# Patient Record
Sex: Female | Born: 1980 | Race: Black or African American | Hispanic: No | Marital: Married | Smoking: Never smoker
Health system: Southern US, Community
[De-identification: ages and names within clinical notes are randomized; demographics above are authoritative.]

## PROBLEM LIST (undated history)

## (undated) ENCOUNTER — Inpatient Hospital Stay (HOSPITAL_COMMUNITY): Payer: Self-pay

## (undated) DIAGNOSIS — N390 Urinary tract infection, site not specified: Secondary | ICD-10-CM

## (undated) DIAGNOSIS — A419 Sepsis, unspecified organism: Secondary | ICD-10-CM

## (undated) DIAGNOSIS — E229 Hyperfunction of pituitary gland, unspecified: Secondary | ICD-10-CM

## (undated) DIAGNOSIS — A0100 Typhoid fever, unspecified: Secondary | ICD-10-CM

## (undated) DIAGNOSIS — R2230 Localized swelling, mass and lump, unspecified upper limb: Secondary | ICD-10-CM

## (undated) DIAGNOSIS — D352 Benign neoplasm of pituitary gland: Secondary | ICD-10-CM

## (undated) DIAGNOSIS — F329 Major depressive disorder, single episode, unspecified: Secondary | ICD-10-CM

## (undated) DIAGNOSIS — N926 Irregular menstruation, unspecified: Secondary | ICD-10-CM

## (undated) HISTORY — DX: Irregular menstruation, unspecified: N92.6

## (undated) HISTORY — DX: Urinary tract infection, site not specified: N39.0

## (undated) HISTORY — PX: ABDOMINAL SURGERY: SHX537

## (undated) HISTORY — DX: Sepsis, unspecified organism: A41.9

## (undated) HISTORY — DX: Typhoid fever, unspecified: A01.00

## (undated) HISTORY — DX: Localized swelling, mass and lump, unspecified upper limb: R22.30

## (undated) HISTORY — DX: Major depressive disorder, single episode, unspecified: F32.9

---

## 2003-03-02 ENCOUNTER — Encounter: Payer: Self-pay | Admitting: *Deleted

## 2003-03-02 ENCOUNTER — Encounter: Payer: Self-pay | Admitting: Family Medicine

## 2003-03-02 ENCOUNTER — Inpatient Hospital Stay (HOSPITAL_COMMUNITY): Admission: AD | Admit: 2003-03-02 | Discharge: 2003-03-02 | Payer: Self-pay | Admitting: *Deleted

## 2003-05-08 ENCOUNTER — Inpatient Hospital Stay (HOSPITAL_COMMUNITY): Admission: RE | Admit: 2003-05-08 | Discharge: 2003-05-16 | Payer: Self-pay | Admitting: Surgery

## 2003-05-08 ENCOUNTER — Encounter (INDEPENDENT_AMBULATORY_CARE_PROVIDER_SITE_OTHER): Payer: Self-pay

## 2003-11-23 ENCOUNTER — Encounter: Admission: RE | Admit: 2003-11-23 | Discharge: 2003-11-23 | Payer: Self-pay | Admitting: Internal Medicine

## 2004-01-24 ENCOUNTER — Encounter: Admission: RE | Admit: 2004-01-24 | Discharge: 2004-01-24 | Payer: Self-pay | Admitting: Family Medicine

## 2004-02-13 ENCOUNTER — Ambulatory Visit (HOSPITAL_COMMUNITY): Admission: RE | Admit: 2004-02-13 | Discharge: 2004-02-13 | Payer: Self-pay | Admitting: Family Medicine

## 2004-04-01 ENCOUNTER — Ambulatory Visit: Payer: Self-pay | Admitting: Obstetrics and Gynecology

## 2004-04-25 ENCOUNTER — Ambulatory Visit: Payer: Self-pay | Admitting: Internal Medicine

## 2004-12-06 ENCOUNTER — Emergency Department (HOSPITAL_COMMUNITY): Admission: EM | Admit: 2004-12-06 | Discharge: 2004-12-06 | Payer: Self-pay | Admitting: Emergency Medicine

## 2004-12-10 ENCOUNTER — Ambulatory Visit: Payer: Self-pay | Admitting: Internal Medicine

## 2005-01-09 ENCOUNTER — Ambulatory Visit: Payer: Self-pay | Admitting: Internal Medicine

## 2005-04-08 ENCOUNTER — Ambulatory Visit: Payer: Self-pay | Admitting: Internal Medicine

## 2005-04-21 ENCOUNTER — Ambulatory Visit: Payer: Self-pay | Admitting: Internal Medicine

## 2005-05-06 ENCOUNTER — Ambulatory Visit: Payer: Self-pay | Admitting: Internal Medicine

## 2005-05-13 ENCOUNTER — Ambulatory Visit: Payer: Self-pay | Admitting: Internal Medicine

## 2005-05-26 ENCOUNTER — Ambulatory Visit: Payer: Self-pay | Admitting: Internal Medicine

## 2005-06-05 ENCOUNTER — Ambulatory Visit: Payer: Self-pay | Admitting: Internal Medicine

## 2005-06-10 ENCOUNTER — Ambulatory Visit: Payer: Self-pay | Admitting: Internal Medicine

## 2005-09-08 ENCOUNTER — Encounter (INDEPENDENT_AMBULATORY_CARE_PROVIDER_SITE_OTHER): Payer: Self-pay | Admitting: Internal Medicine

## 2005-12-28 ENCOUNTER — Ambulatory Visit: Payer: Self-pay | Admitting: Internal Medicine

## 2006-01-04 ENCOUNTER — Ambulatory Visit: Payer: Self-pay | Admitting: Internal Medicine

## 2006-06-03 ENCOUNTER — Encounter (INDEPENDENT_AMBULATORY_CARE_PROVIDER_SITE_OTHER): Payer: Self-pay | Admitting: *Deleted

## 2006-06-03 ENCOUNTER — Ambulatory Visit: Payer: Self-pay | Admitting: *Deleted

## 2006-06-03 DIAGNOSIS — N926 Irregular menstruation, unspecified: Secondary | ICD-10-CM | POA: Insufficient documentation

## 2006-06-03 DIAGNOSIS — A0100 Typhoid fever, unspecified: Secondary | ICD-10-CM | POA: Insufficient documentation

## 2006-06-03 DIAGNOSIS — N979 Female infertility, unspecified: Secondary | ICD-10-CM

## 2006-06-03 LAB — CONVERTED CEMR LAB: Streptococcus, Group A Screen (Direct): NEGATIVE

## 2006-07-19 ENCOUNTER — Telehealth (INDEPENDENT_AMBULATORY_CARE_PROVIDER_SITE_OTHER): Payer: Self-pay | Admitting: *Deleted

## 2006-07-29 ENCOUNTER — Ambulatory Visit: Payer: Self-pay | Admitting: Internal Medicine

## 2006-07-29 ENCOUNTER — Encounter (INDEPENDENT_AMBULATORY_CARE_PROVIDER_SITE_OTHER): Payer: Self-pay | Admitting: Unknown Physician Specialty

## 2006-07-29 DIAGNOSIS — N941 Unspecified dyspareunia: Secondary | ICD-10-CM

## 2006-07-29 LAB — CONVERTED CEMR LAB
Bilirubin Urine: NEGATIVE
Blood in Urine, dipstick: NEGATIVE
Glucose, Urine, Semiquant: NEGATIVE
Ketones, urine, test strip: NEGATIVE
Nitrite: NEGATIVE
Protein, U semiquant: 30
Specific Gravity, Urine: 1.02
Urobilinogen, UA: 0.2
WBC Urine, dipstick: NEGATIVE
pH: 7.5

## 2007-03-15 ENCOUNTER — Ambulatory Visit (HOSPITAL_BASED_OUTPATIENT_CLINIC_OR_DEPARTMENT_OTHER): Admission: RE | Admit: 2007-03-15 | Discharge: 2007-03-15 | Payer: Self-pay | Admitting: Orthopaedic Surgery

## 2007-07-19 ENCOUNTER — Ambulatory Visit: Payer: Self-pay | Admitting: Internal Medicine

## 2007-07-19 ENCOUNTER — Encounter (INDEPENDENT_AMBULATORY_CARE_PROVIDER_SITE_OTHER): Payer: Self-pay | Admitting: Internal Medicine

## 2007-07-19 DIAGNOSIS — R042 Hemoptysis: Secondary | ICD-10-CM | POA: Insufficient documentation

## 2007-07-19 DIAGNOSIS — R040 Epistaxis: Secondary | ICD-10-CM

## 2007-07-19 DIAGNOSIS — H5310 Unspecified subjective visual disturbances: Secondary | ICD-10-CM | POA: Insufficient documentation

## 2007-07-19 LAB — CONVERTED CEMR LAB
Eosinophils Absolute: 0.1 10*3/uL (ref 0.0–0.7)
Eosinophils Relative: 2 % (ref 0–5)
HCT: 43.5 % (ref 36.0–46.0)
Lymphs Abs: 2.1 10*3/uL (ref 0.7–4.0)
MCV: 90.2 fL (ref 78.0–100.0)
Monocytes Absolute: 0.5 10*3/uL (ref 0.1–1.0)
Platelets: 250 10*3/uL (ref 150–400)
RDW: 12.1 % (ref 11.5–15.5)
WBC: 4.5 10*3/uL (ref 4.0–10.5)

## 2007-07-21 ENCOUNTER — Encounter (INDEPENDENT_AMBULATORY_CARE_PROVIDER_SITE_OTHER): Payer: Self-pay | Admitting: Internal Medicine

## 2007-07-25 ENCOUNTER — Encounter (INDEPENDENT_AMBULATORY_CARE_PROVIDER_SITE_OTHER): Payer: Self-pay | Admitting: Internal Medicine

## 2007-07-28 ENCOUNTER — Encounter (INDEPENDENT_AMBULATORY_CARE_PROVIDER_SITE_OTHER): Payer: Self-pay | Admitting: Internal Medicine

## 2007-08-17 ENCOUNTER — Encounter (INDEPENDENT_AMBULATORY_CARE_PROVIDER_SITE_OTHER): Payer: Self-pay | Admitting: Internal Medicine

## 2007-10-07 ENCOUNTER — Ambulatory Visit (HOSPITAL_COMMUNITY): Admission: RE | Admit: 2007-10-07 | Discharge: 2007-10-07 | Payer: Self-pay | Admitting: Internal Medicine

## 2007-10-19 ENCOUNTER — Ambulatory Visit (HOSPITAL_COMMUNITY): Admission: RE | Admit: 2007-10-19 | Discharge: 2007-10-19 | Payer: Self-pay | Admitting: Obstetrics and Gynecology

## 2007-11-29 ENCOUNTER — Other Ambulatory Visit: Admission: RE | Admit: 2007-11-29 | Discharge: 2007-11-29 | Payer: Self-pay | Admitting: Gynecology

## 2007-12-28 ENCOUNTER — Ambulatory Visit: Payer: Self-pay | Admitting: *Deleted

## 2007-12-28 ENCOUNTER — Encounter: Payer: Self-pay | Admitting: Internal Medicine

## 2007-12-28 DIAGNOSIS — F3289 Other specified depressive episodes: Secondary | ICD-10-CM

## 2007-12-28 DIAGNOSIS — F329 Major depressive disorder, single episode, unspecified: Secondary | ICD-10-CM

## 2007-12-28 DIAGNOSIS — K055 Other periodontal diseases: Secondary | ICD-10-CM

## 2007-12-28 HISTORY — DX: Other specified depressive episodes: F32.89

## 2007-12-28 HISTORY — DX: Major depressive disorder, single episode, unspecified: F32.9

## 2007-12-28 LAB — CONVERTED CEMR LAB
Albumin: 4.8 g/dL (ref 3.5–5.2)
Beta hcg, urine, semiquantitative: POSITIVE
CO2: 19 meq/L (ref 19–32)
Calcium: 10.2 mg/dL (ref 8.4–10.5)
Chloride: 103 meq/L (ref 96–112)
Free T4: 1.11 ng/dL (ref 0.89–1.80)
Glucose, Bld: 67 mg/dL — ABNORMAL LOW (ref 70–99)
Potassium: 4.1 meq/L (ref 3.5–5.3)
Sodium: 136 meq/L (ref 135–145)
TSH: 0.924 microintl units/mL (ref 0.350–4.50)
Total Bilirubin: 0.4 mg/dL (ref 0.3–1.2)
Total Protein: 7.8 g/dL (ref 6.0–8.3)

## 2007-12-29 DIAGNOSIS — O0001 Abdominal pregnancy with intrauterine pregnancy: Secondary | ICD-10-CM

## 2008-01-11 ENCOUNTER — Encounter: Payer: Self-pay | Admitting: Obstetrics and Gynecology

## 2008-01-11 ENCOUNTER — Ambulatory Visit: Payer: Self-pay | Admitting: Obstetrics & Gynecology

## 2008-02-08 ENCOUNTER — Ambulatory Visit: Payer: Self-pay | Admitting: Obstetrics & Gynecology

## 2008-02-08 ENCOUNTER — Ambulatory Visit (HOSPITAL_COMMUNITY): Admission: RE | Admit: 2008-02-08 | Discharge: 2008-02-08 | Payer: Self-pay | Admitting: Family Medicine

## 2008-02-15 ENCOUNTER — Ambulatory Visit: Payer: Self-pay | Admitting: Obstetrics & Gynecology

## 2008-03-01 ENCOUNTER — Ambulatory Visit: Payer: Self-pay | Admitting: Obstetrics & Gynecology

## 2008-03-30 ENCOUNTER — Ambulatory Visit (HOSPITAL_COMMUNITY): Admission: RE | Admit: 2008-03-30 | Discharge: 2008-03-30 | Payer: Self-pay | Admitting: Obstetrics & Gynecology

## 2008-04-03 ENCOUNTER — Ambulatory Visit (HOSPITAL_COMMUNITY): Admission: RE | Admit: 2008-04-03 | Discharge: 2008-04-03 | Payer: Self-pay | Admitting: Obstetrics & Gynecology

## 2008-06-04 ENCOUNTER — Ambulatory Visit (HOSPITAL_COMMUNITY): Admission: RE | Admit: 2008-06-04 | Discharge: 2008-06-04 | Payer: Self-pay | Admitting: Obstetrics & Gynecology

## 2008-06-27 ENCOUNTER — Ambulatory Visit (HOSPITAL_COMMUNITY): Admission: RE | Admit: 2008-06-27 | Discharge: 2008-06-27 | Payer: Self-pay | Admitting: Obstetrics & Gynecology

## 2008-07-12 ENCOUNTER — Inpatient Hospital Stay (HOSPITAL_COMMUNITY): Admission: AD | Admit: 2008-07-12 | Discharge: 2008-07-15 | Payer: Self-pay | Admitting: Obstetrics & Gynecology

## 2008-07-31 ENCOUNTER — Inpatient Hospital Stay (HOSPITAL_COMMUNITY): Admission: AD | Admit: 2008-07-31 | Discharge: 2008-08-02 | Payer: Self-pay | Admitting: Obstetrics

## 2008-07-31 ENCOUNTER — Encounter: Payer: Self-pay | Admitting: Obstetrics

## 2009-02-07 IMAGING — CT CT ABDOMEN W/ CM
2 of 5 series · 16 of 46 positions shown, 18 images · IV contrast (OMNI 300/WATER & 100 ML OMNI 300)
Comparison: None

CT ABDOMEN

CLINICAL DATA: Abdominal pain: reported history of bowel resection

CT ABDOMEN AND PELVIS WITH CONTRAST
TECHNIQUE: Multidetector CT imaging of the abdomen and pelvis was
performed using the standard protocol following bolus
administration of intravenous contrast.
Contrast: 100 ml 2mnipaque-XBB

[Series 2: routine abdomen · axial · 0.70mm/px · z∈[-388,-28]mm · 13 of 84 slices shown, 15 images]
[im 6/84  soft-tissue]
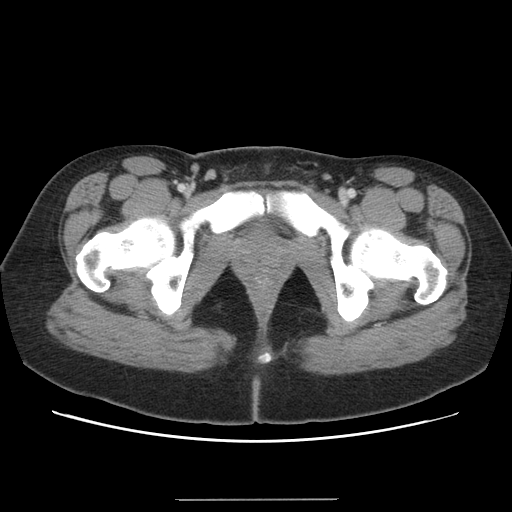
[im 6/84  bone]
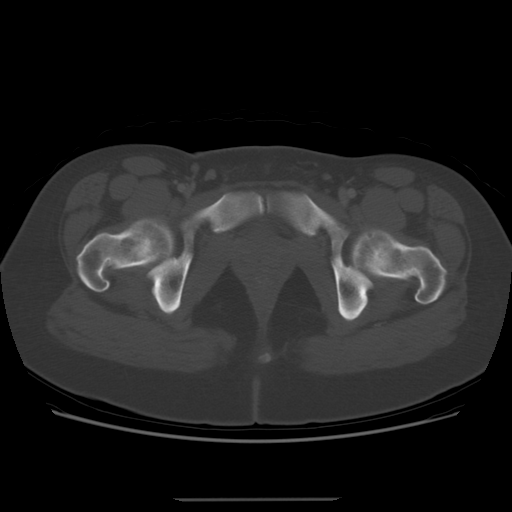
[im 11/84  soft-tissue]
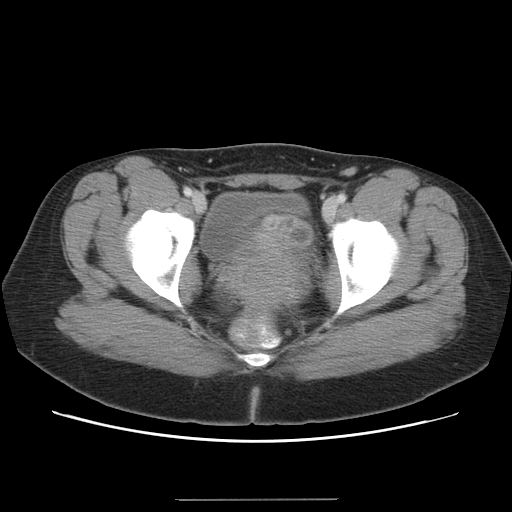
[im 16/84  soft-tissue]
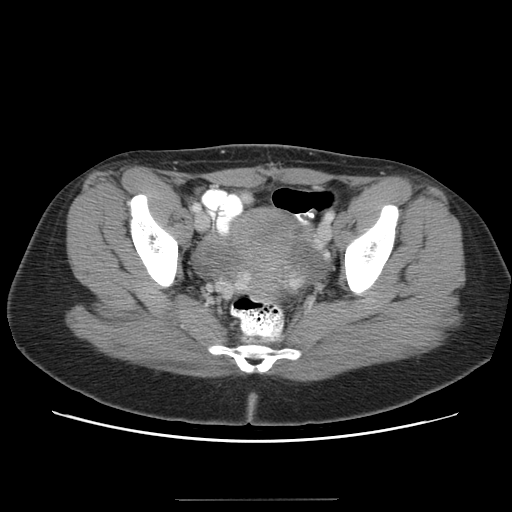
[im 26/84  soft-tissue]
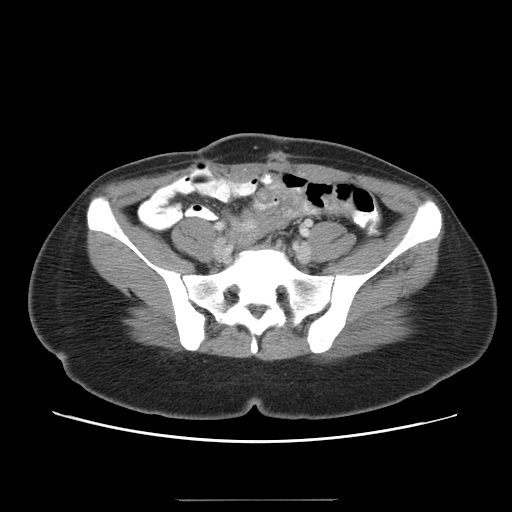
[im 32/84  soft-tissue]
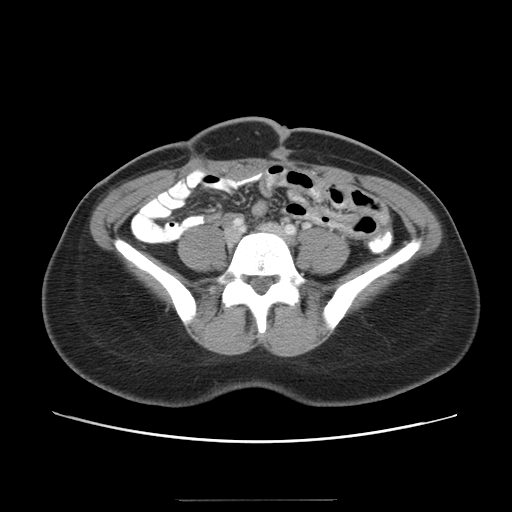
[im 37/84  soft-tissue]
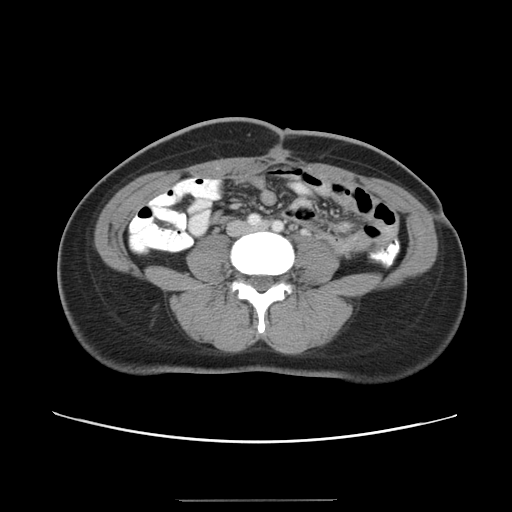
[im 42/84  soft-tissue]
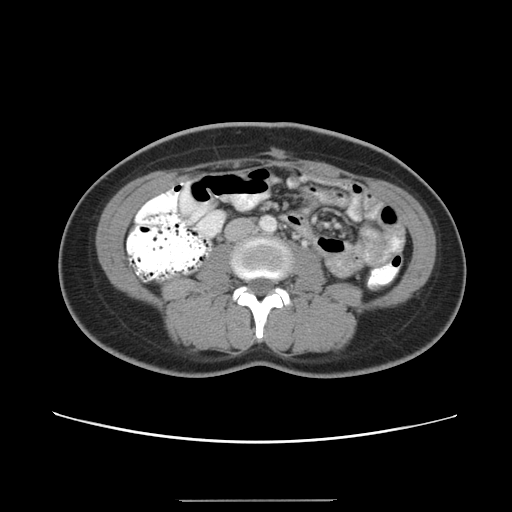
[im 47/84  soft-tissue]
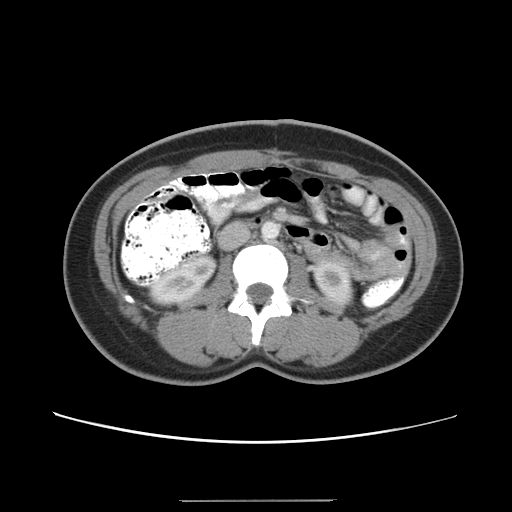
[im 52/84  soft-tissue]
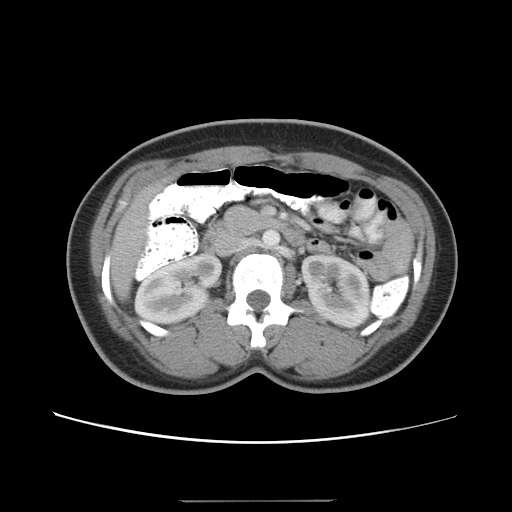
[im 52/84  bone]
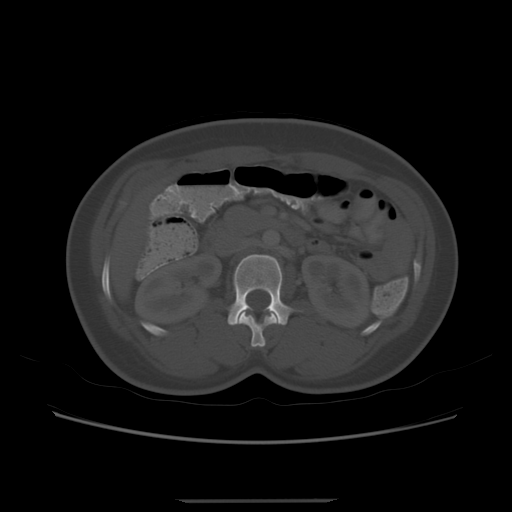
[im 58/84  soft-tissue]
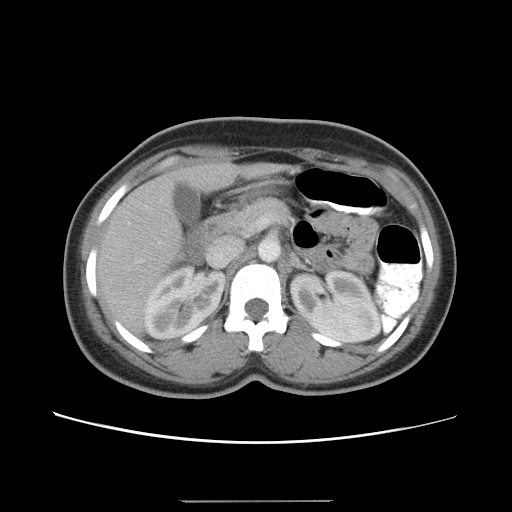
[im 68/84  soft-tissue]
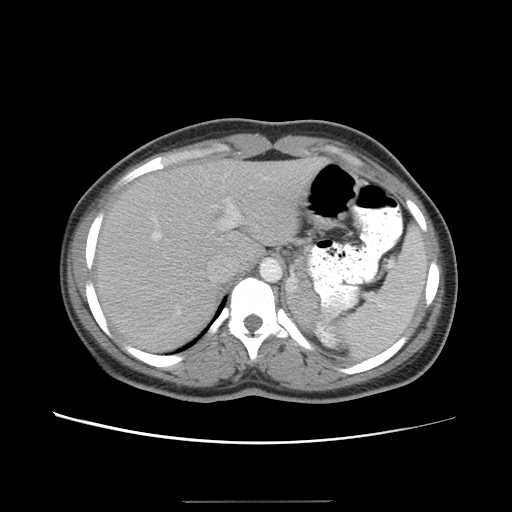
[im 73/84  soft-tissue]
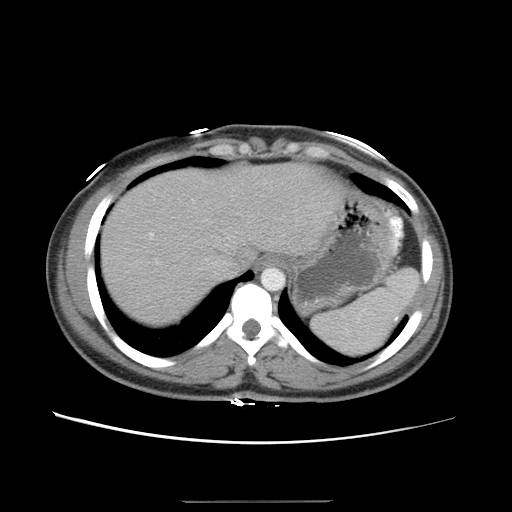
[im 78/84  soft-tissue]
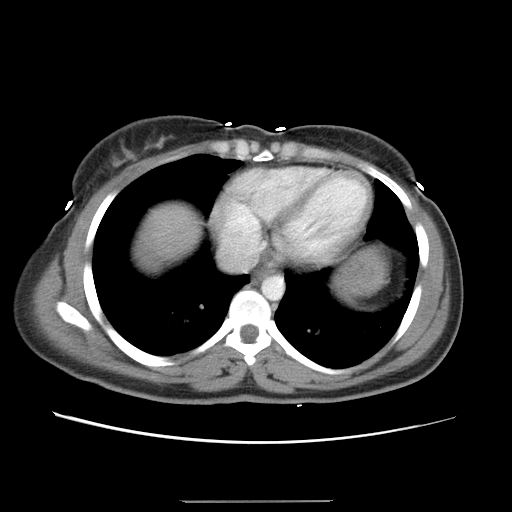

[Series 401: reformatted · coronal · 0.86mm/px · 3 of 83 slices shown]
[im 28/83  soft-tissue]
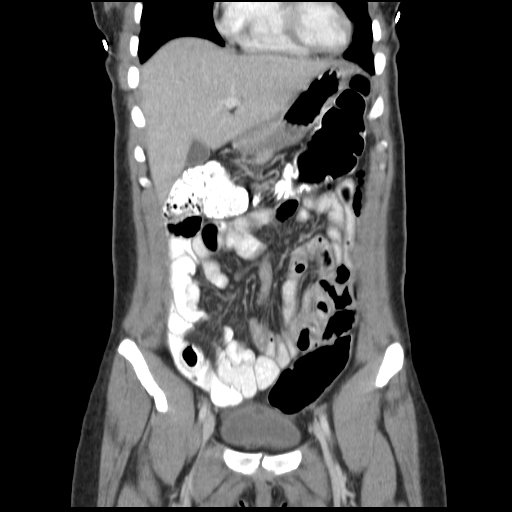
[im 37/83  soft-tissue]
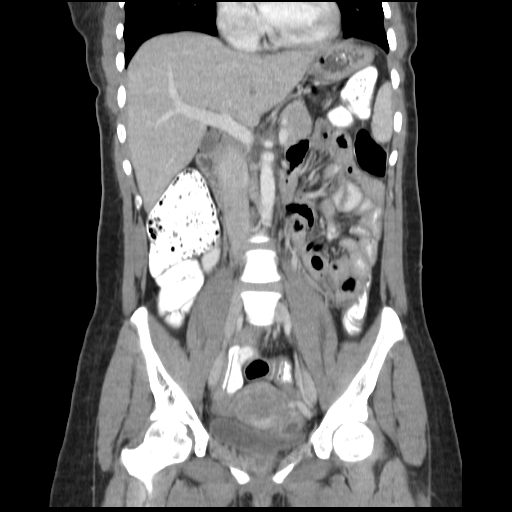
[im 46/83  soft-tissue]
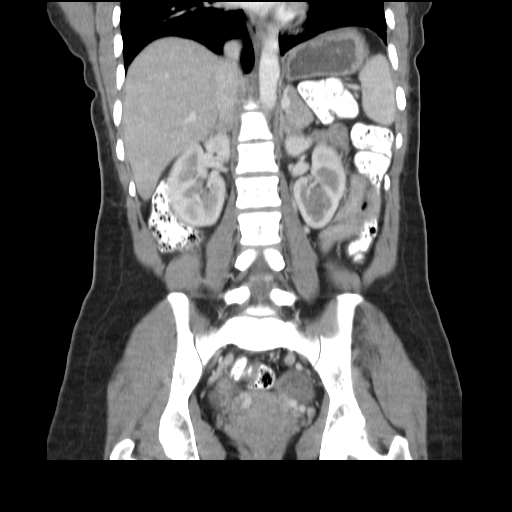

[16 of 46 positions shown; findings below may reference images not displayed]

FINDINGS: The liver, spleen, pancreas, kidneys and adrenal glands
appear normal.  No gallbladder abnormality.  No biliary duct
dilatation.  No abdominal mass or pathologically enlarged lymph
nodes.  Midline anterior abdominal wall deep subcutaneous scar
which appears somewhat thickened.  It is focal thickening of scan
and anterior bowel wall indentation suggesting scarring in the
right lower quadrant with underlying contrast filled bowel loops.
There is senescent cysts small herniation at that site.  This may
represent a site of this site of a prior ostomy.  No findings to
suggest bowel obstruction. Focal soft tissue structure in the
mesentery is appreciated on image 53.  I feel this most like
represents a unopacified segment of bowel as opposed to an enlarged
mesenteric lymph node.  There may be a few slightly prominent size
mesenteric nodes.
IMPRESSION: No acute abdominal findings. See comments above

CT PELVIS
FINDINGS: Unremarkable appearance of the uterus.  Bilaterally the
ovaries appear prominent size.  The right ovary measures
approximately 3.0-0.5 cm and the left ovary approximately 3.05 or
0.0 cm.  The ovaries appear somewhat hypoattenuated.  There are
globular configuration.  Findings raise a question of polycystic
ovaries.  Is there clinical concern for polycystic ovarian
syndrome?  There is a  approximate 1.9 x 2-0.9 cm mass in the left
anterior pelvis which is contiguous with the uterus.  I believe
that this mass is separate from the ovaries.  It appears
multiseptated with multiple fluid-filled locules.  The etiology is
uncertain.  Possibilities include a teratoma, endometrioma, unusual
appearance of exophytic fibroid, and malignant tumor, including
fallopian tube mass.  Recommend ultrasound for further assessment.
No free pelvic fluid.  Prominent periuterine venous channels may
potentially represent a manifestation of pelvic congestion
syndrome.  Suggestion of prominent mesenteric nodes right lower
quadrant, one with short axis diameter 9 mm (image 56).
IMPRESSION: Fine described above raise question of polycystic ovarian syndrome
and also query pelvic congestion syndrome in the proper clinical
setting.. Mild mesenteric adenopathy.  Pelvic mass of undetermined
etiology as above.  Query site of origin of the mass. Recommend
ultrasound.

## 2009-08-02 ENCOUNTER — Ambulatory Visit: Payer: Self-pay | Admitting: Internal Medicine

## 2009-08-02 LAB — HM PAP SMEAR

## 2009-08-27 ENCOUNTER — Ambulatory Visit: Payer: Self-pay | Admitting: Internal Medicine

## 2009-08-27 DIAGNOSIS — L0293 Carbuncle, unspecified: Secondary | ICD-10-CM

## 2009-08-27 DIAGNOSIS — L0292 Furuncle, unspecified: Secondary | ICD-10-CM | POA: Insufficient documentation

## 2009-09-03 ENCOUNTER — Ambulatory Visit: Payer: Self-pay | Admitting: Internal Medicine

## 2009-09-09 ENCOUNTER — Telehealth (INDEPENDENT_AMBULATORY_CARE_PROVIDER_SITE_OTHER): Payer: Self-pay | Admitting: Internal Medicine

## 2009-12-04 ENCOUNTER — Ambulatory Visit (HOSPITAL_COMMUNITY): Admission: RE | Admit: 2009-12-04 | Discharge: 2009-12-04 | Payer: Self-pay | Admitting: Obstetrics & Gynecology

## 2010-04-18 ENCOUNTER — Encounter: Payer: Self-pay | Admitting: Obstetrics & Gynecology

## 2010-04-18 ENCOUNTER — Inpatient Hospital Stay (HOSPITAL_COMMUNITY): Admission: AD | Admit: 2010-04-18 | Discharge: 2010-04-20 | Payer: Self-pay | Admitting: Obstetrics

## 2010-07-12 ENCOUNTER — Encounter: Payer: Self-pay | Admitting: Obstetrics & Gynecology

## 2010-07-22 NOTE — Assessment & Plan Note (Signed)
Summary: 1WK F/U/[facility]/VS   Vital Signs:  Patient profile:   30 year old female Height:      63 inches (160.02 cm) Weight:      156.7 pounds (71.23 kg) BMI:     27.86 Temp:     97.1 degrees F Pulse rate:   98 / minute BP sitting:   128 / 74  (right arm) Cuff size:   regular  Vitals Entered By: Dorie Rank RN (September 03, 2009 10:20 AM) CC: recheck boils under left arm (better per pt)-  - also supposed to pregnancy test back before can get vaccinations - has not yet had menstrual period yet Is Patient Diabetic? No Pain Assessment Patient in pain? no      Nutritional Status BMI of 25 - 29 = overweight  Have you ever been in a relationship where you felt threatened, hurt or afraid?No   Does patient need assistance? Functional Status Self care Ambulation Normal   Primary Care Provider:  Elby Showers MD  CC:  recheck boils under left arm (better per pt)-  - also supposed to pregnancy test back before can get vaccinations - has not yet had menstrual period yet.  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 -Irregular menses Followed by Dr. Okey Dupre  and [facility]. 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 She says that the pain is completely gone and she has been noticing a decrease in size of the swelling.  She is compliant with meds and also took hibiclin baths. She is also asking for something for her scar in the belly. She hasnt had her menstrual periods yet and asks for a pregnancy test. No other complaints.  Depression History:      The patient denies a depressed mood most of the day and a diminished interest in her usual daily activities.         Comments:  "not a problem".   Preventive Screening-Counseling & Management  Alcohol-Tobacco     Alcohol type: occasioally - wine     Smoking Status: never  Caffeine-Diet-Exercise     Does Patient Exercise: yes     Type of exercise: sometimes  Problems Prior to Update: 1)  Boils, Recurrent  (ICD-680.9) 2)  Routine Gynecological Examination  (ICD-V72.31) 3)  Abdominal Pregnancy With Intrauterine Pregnancy  (ICD-633.01) 4)  Bleeding Gums  (ICD-523.8) 5)  Depression  (ICD-311) 6)  Unspecified Subjective Visual Disturbance  (ICD-368.10) 7)  Hemoptysis  (ICD-786.3) 8)  Epistaxis  (ICD-784.7) 9)  Abdominal Pain, Chronic  (ICD-789.00) 10)  Irregular Menses  (ICD-626.4) 11)  Typhoid Fever  (ICD-002.0) 12)  Female Infertility  (ICD-628.9)  Medications Prior to Update: 1)  Doxepin Hcl 100 Mg Caps (Doxepin Hcl) .... Take 1 Tablet By Mouth Two Times A Day 2)  Chlorhexidine Gluconate 4 % Liqd (Chlorhexidine Gluconate) .... Use As Directed Everyday For 5 Days and Then Stop.  Current Medications (verified): 1)  Scar Gel  Gel (Scar Treatment Products) .... Apply To Affected Area 2-3 Times A Day.  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 Okey Dupre  and [facility] clinic. 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  Social History: Last updated: 07/29/2006 Never Smoked Alcohol use-yes, occ  Risk Factors: Exercise: yes (09/03/2009)  Risk Factors: Smoking Status: never (09/03/2009)  Review of Systems      See  HPI  Physical Exam  Additional Exam:  Gen: AOx3, in no acute distress, in wheel chair Eyes: PERRL, EOMI ENT:MMM, No erythema noted in posterior pharynx Neck: No JVD, No LAP Chest: CTAB with  good respiratory effort CVS: regular rhythmic rate, NO M/R/G, S1 S2 normal Abdo: soft,ND, BS+x4, Non tender and No hepatosplenomegaly, midline incision with scarring EXT: No odema noted, axillary mass 1x2 cm in size non tender,fluctuant, deeply embedded in the axilla Neuro: Non focal, gait is normal Skin: no rashes noted.    Impression & Recommendations:  Problem # 1:  BOILS, RECURRENT (ICD-680.9) Assessment Improved Patient has been compliant withnher meds and reports improvement in the size of swelling as weel as pain associated with it. We started the patient on Doxycycline after negative pregnancy test during last visit which she took infrequently. UPT done today in clinic is positive and I instructed her to stop Doxy completely. I also informed her about the potential harmful effects of Doxycycline which she completely understands and is visibly scared. I will refer her to a gynecologist that she has been following for further follow up.  Problem # 2:  Preventive Health Care (ICD-V70.0) Assessment: Comment Only We dicided not to give her tetanus and flu shot at this time as she is pregnant and her [place] [facility] should follow up with her on all these things.  Problem # 3:  ABDOMINAL PREGNANCY WITH INTRAUTERINE PREGNANCY (ICD-633.01) Pregnancy test done in house was positive. She was asking for multivitamins decision to start which was deferred to her primary obstetrician at this time.  Complete Medication List: 1)  Scar Gel Gel (Scar treatment products) .... Apply to affected area 2-3 times a day.  Other Orders: T-Urine Pregnancy (in -house) [id number])  Patient Instructions: 1)  Please schedule a follow-up appointment as needed. 2)  It is important that you exercise regularly at least 20  minutes 5 times a week. If you develop chest pain, have severe difficulty breathing, or feel very tired , stop exercising immediately and seek medical attention. 3)  You need to lose weight. Consider a lower calorie diet and regular exercise.  Prescriptions: SCAR GEL  GEL (SCAR TREATMENT PRODUCTS) apply to affected area 2-3 times a day.  #2 x 2   Entered and Authorized by:   Lars Mage MD   Signed by:   Lars Mage MD on 09/03/2009   Method used:   Print then Give to Patient   RxID:   [id number]     Laboratory Results   Urine Tests  Date/Time Received: September 03, 2009 10:57 AM  Date/Time Reported: Burke Keels  September 03, 2009 10:57 AM     Urine HCG: positive Comments: Urine Specific Gravity 1.028  Burke Keels  September 03, 2009 10:57 AM

## 2010-07-22 NOTE — Assessment & Plan Note (Signed)
Summary: ACUTE-BOIL  ON LEFT HAND X 1 WEEK/CFB([facility])   Vital Signs:  Patient profile:   30 year old female Height:      65 inches (165.10 cm) Weight:      152.05 pounds (69.11 kg) BMI:     25.39 Temp:     98.1 degrees F (36.72 degrees C) oral Pulse rate:   86 / minute BP sitting:   108 / 68  (right arm)  Vitals Entered By: Angelina Ok RN (August 27, 2009 9:26 AM) Is Patient Diabetic? No Pain Assessment Patient in pain? yes     Location: under left armpit Intensity: 5 Type: sore Onset of pain  Constant Nutritional Status BMI of 25 - 29 = overweight  Have you ever been in a relationship where you felt threatened, hurt or afraid?No   Does patient need assistance? Functional Status Self care Ambulation Normal Comments Boil under left armpit x 2 weeks.  No drainage.  Has had before.   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 Okey Dupre  and [facility] clinic. 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 It is something very similar to what she has all the time. 2X3 cm in sixe, tender and non draining.  No h/o fever, chills, decrease in appetite or difficluty in breathing.    Depression History:      The patient denies a depressed mood most of the day and a diminished interest in her usual daily activities.         Preventive Screening-Counseling & Management  Alcohol-Tobacco     Alcohol type: occasioally - wine     Smoking Status: never  Problems Prior to Update: 1)  Routine Gynecological Examination  (ICD-V72.31) 2)  Abdominal Pregnancy With Intrauterine Pregnancy  (ICD-633.01) 3)  Bleeding Gums   (ICD-523.8) 4)  Depression  (ICD-311) 5)  Unspecified Subjective Visual Disturbance  (ICD-368.10) 6)  Hemoptysis  (ICD-786.3) 7)  Epistaxis  (ICD-784.7) 8)  Abdominal Pain, Chronic  (ICD-789.00) 9)  Irregular Menses  (ICD-626.4) 10)  Typhoid Fever  (ICD-002.0) 11)  Female Infertility  (ICD-628.9)  Medications Prior to Update: 1)  None  Current Medications (verified): 1)  Doxepin Hcl 100 Mg Caps (Doxepin Hcl) .... Take 1 Tablet By Mouth Two Times A Day 2)  Chlorhexidine Gluconate 4 % Liqd (Chlorhexidine Gluconate) .... Use As Directed Everyday For 5 Days and Then Stop.  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 Okey Dupre  and [facility] clinic. 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  Social History: Last updated: 07/29/2006 Never Smoked Alcohol use-yes, occ  Risk Factors: Exercise: yes (08/02/2009)  Risk Factors: Smoking Status: never (08/27/2009)  Review of Systems      See HPI  Physical Exam  Additional Exam:  Gen: AOx3, in no acute distress Eyes: PERRL, EOMI ENT:MMM, No erythema noted in posterior pharynx Neck: No JVD, No LAP Chest: CTAB with  good respiratory effort CVS: regular rhythmic rate, NO M/R/G, S1 S2 normal Abdo: soft,ND, BS+x4, Non tender and No hepatosplenomegaly EXT: No odema noted, 2x3 cm non fluctuant mass in the left axillary region with no signs of inflammation, tender+, thick overlying skin. Neuro: Non focal, gait is normal Skin: no rashes noted.    Impression &  Recommendations:  Problem # 1:  BOILS, RECURRENT (ICD-680.9) We will prescribe antibiotics coering for MRSA and also advise warm compresses. The mass is non fluctuant and there seems to be little benefit in I and D. Dr Aundria Rud agrees with the plan. We will also give her some hibeclin bath for decontamination.  Problem # 2:  IRREGULAR MENSES (ICD-626.4)  Discussed medication use. Pregnancy test was done in clinic which was negative.  Problem # 3:  Preventive Health Care (ICD-V70.0) Discussed the flu shot and tetanus but she denied as she said that she is going to get it the next week when she follows up.  Complete Medication List: 1)  Doxepin Hcl 100 Mg Caps (Doxepin hcl) .... Take 1 tablet by mouth two times a day 2)  Chlorhexidine Gluconate 4 % Liqd (Chlorhexidine gluconate) .... Use as directed everyday for 5 days and then stop.  Patient Instructions: 1)  Please schedule a follow-up appointment in 1 weeks. 2)  Follow up with clinic if it does not become better or it starts draining pus from it. 3)  Apply warm compresses 5 times a day to the affected area. Prescriptions: CHLORHEXIDINE GLUCONATE 4 % LIQD (CHLORHEXIDINE GLUCONATE) use as directed everyday for 5 days and then stop.  #1 x 1   Entered and Authorized by:   Lars Mage MD   Signed by:   Lars Mage MD on 08/27/2009   Method used:   Print then Give to Patient   RxID:   [id number] DOXEPIN HCL 100 MG CAPS (DOXEPIN HCL) Take 1 tablet by mouth two times a day  #14 x 0   Entered and Authorized by:   Lars Mage MD   Signed by:   Lars Mage MD on 08/27/2009   Method used:   Print then Give to Patient   RxID:   [id number]   Prevention & Chronic Care Immunizations   Influenza vaccine: Not documented    Tetanus booster: Not documented    Pneumococcal vaccine: Not documented  Other Screening   Pap smear:  Specimen Adequacy: Satisfactory for evaluation.   Interpretation/Result:Negative for intraepithelial Lesion or  Malignancy.     (08/02/2009)   Pap smear due: 07/2010   Smoking status: never  (08/27/2009)   Nursing Instructions: Give Flu vaccine today Give tetanus booster today      Vital Signs:  Patient profile:   30 year old female Height:      65 inches (165.10 cm) Weight:      152.05 pounds (69.11 kg) BMI:     25.39 Temp:     98.1 degrees F (36.72 degrees C) oral Pulse rate:   86 / minute BP sitting:   108 / 68  (right arm)  Vitals Entered By: Angelina Ok RN (August 27, 2009 9:26 AM)

## 2010-07-22 NOTE — Assessment & Plan Note (Signed)
Summary: ROUTINE CK/PAP SMEAR/EST/VS   Vital Signs:  Patient profile:   30 year old female Height:      65 inches (165.10 cm) Weight:      150.7 pounds (68.50 kg) BMI:     25.17 Temp:     97.3 degrees F (36.28 degrees C) oral Pulse rate:   73 / minute BP sitting:   108 / 71  (right arm)  Vitals Entered By: Stanton Kidney Ditzler RN (August 02, 2009 2:28 PM) Is Patient Diabetic? No Pain Assessment Patient in pain? no      Nutritional Status BMI of 25 - 29 = overweight Nutritional Status Detail appetite good  Have you ever been in a relationship where you felt threatened, hurt or afraid?denies   Does patient need assistance? Functional Status Self care Ambulation Normal Comments Ck-up and time for pap - LMP 07/28/09 - flow lighter - contra none. Pt ? preg.   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 Okey Dupre  and [facility]. 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  Reports that her last period was very light and wonders if she could be pregnant.        Depression History:      The patient denies a depressed mood most of the day and a diminished interest in her usual daily activities.         Preventive Screening-Counseling & Management  Alcohol-Tobacco     Alcohol type: occasioally - wine     Smoking Status: never  Caffeine-Diet-Exercise     Does Patient Exercise: yes     Type of exercise: sometimes  Current Medications (verified): 1)  None  Allergies (verified): No Known Drug Allergies  Review of Systems       per hpi  Physical Exam  General:  alert and well-developed.   Head:  normocephalic and  atraumatic.   Eyes:  vision grossly intact, pupils equal, pupils round, and pupils reactive to light.   Mouth:  good dentition and pharynx pink and moist.   Lungs:  normal respiratory effort and normal breath sounds.   Heart:  normal rate, regular rhythm, and no murmur.   Abdomen:  soft, non-tender, normal bowel sounds, no distention, no masses, and no guarding.  multiple scars from prior surgery. Genitalia:  normal introitus, no external lesions, no vaginal discharge, mucosa pink and moist, no vaginal or cervical lesions, no vaginal atrophy, no friaility or hemorrhage, and no adnexal masses or tenderness.   Extremities:  no edema Neurologic:  alert & oriented X3, cranial nerves II-XII intact, strength normal in all extremities, and gait normal.   Skin:  no suspicious lesions.   Psych:  Oriented X3, memory intact for recent and remote, normally interactive, good eye contact, and not anxious appearing.     Impression & Recommendations:  Problem # 1:  ROUTINE GYNECOLOGICAL EXAMINATION (ICD-V72.31) normal exam, no complaints, routine pap. negative UPT.  Orders: T-PAP [facility]) [phone])  Problem # 2:  ABDOMINAL PAIN, CHRONIC (ICD-789.00) reports that she still has occasional feeling of fullness, discomfort after eating.  No n/v/d/c.    Other Orders: T-Urine Pregnancy (in -house) (  [id number])  Patient Instructions: 1)  You had labwork done today, we will call you if there is anything that needs to be addressed.   Prevention & Chronic Care Immunizations   Influenza vaccine: Not documented    Tetanus booster: Not documented    Pneumococcal vaccine: Not documented  Other Screening   Pap smear: Not documented   Smoking status: never  (08/02/2009)   Laboratory Results   Urine Tests  Date/Time Received: August 02, 2009 3:11 PM  Date/Time Reported: Burke Keels  August 02, 2009 3:12 PM     Urine HCG: negative Comments: Urine Specific Gravity 1.028 Burke Keels   August 02, 2009 3:12 PM

## 2010-07-22 NOTE — Progress Notes (Signed)
Summary: referral/ [facility]  Phone Note Call from Patient   Caller: Patient Summary of Call: [facility] so she does not have to pay a fee. Initial call taken by: Marin Roberts RN,  September 09, 2009 10:19 AM  Follow-up for Phone Call        Sure!  The order is in.  Hope that actually works.  Please let her know that [facility].  New Problems: PREGNANCY EXAMINATION OR TEST POSITIVE RESULT (ICD-V72.42)   New Problems: PREGNANCY EXAMINATION OR TEST POSITIVE RESULT (ICD-V72.42)

## 2010-09-03 LAB — CBC
HCT: 32.9 % — ABNORMAL LOW (ref 36.0–46.0)
HCT: 36.1 % (ref 36.0–46.0)
Hemoglobin: 12.3 g/dL (ref 12.0–15.0)
MCH: 29.6 pg (ref 26.0–34.0)
MCH: 29.6 pg (ref 26.0–34.0)
MCHC: 34.1 g/dL (ref 30.0–36.0)
MCV: 86.8 fL (ref 78.0–100.0)
MCV: 88.1 fL (ref 78.0–100.0)
Platelets: 268 10*3/uL (ref 150–400)
RDW: 13 % (ref 11.5–15.5)
RDW: 13.2 % (ref 11.5–15.5)

## 2010-09-15 ENCOUNTER — Emergency Department (HOSPITAL_COMMUNITY)
Admission: EM | Admit: 2010-09-15 | Discharge: 2010-09-15 | Discharge status: Against Medical Advice | Payer: No Typology Code available for payment source | Attending: Emergency Medicine | Admitting: Emergency Medicine

## 2010-09-15 DIAGNOSIS — Z0389 Encounter for observation for other suspected diseases and conditions ruled out: Secondary | ICD-10-CM | POA: Insufficient documentation

## 2010-10-06 LAB — PROTIME-INR
INR: 0.9 (ref 0.00–1.49)
Prothrombin Time: 12.3 seconds (ref 11.6–15.2)

## 2010-10-06 LAB — URINALYSIS, ROUTINE W REFLEX MICROSCOPIC
Nitrite: NEGATIVE
Specific Gravity, Urine: 1.015 (ref 1.005–1.030)
Urobilinogen, UA: 0.2 mg/dL (ref 0.0–1.0)
pH: 6.5 (ref 5.0–8.0)

## 2010-10-06 LAB — COMPREHENSIVE METABOLIC PANEL
ALT: 79 U/L — ABNORMAL HIGH (ref 0–35)
ALT: 93 U/L — ABNORMAL HIGH (ref 0–35)
AST: 58 U/L — ABNORMAL HIGH (ref 0–37)
Albumin: 2.6 g/dL — ABNORMAL LOW (ref 3.5–5.2)
Albumin: 3.1 g/dL — ABNORMAL LOW (ref 3.5–5.2)
Alkaline Phosphatase: 205 U/L — ABNORMAL HIGH (ref 39–117)
BUN: 4 mg/dL — ABNORMAL LOW (ref 6–23)
Calcium: 9 mg/dL (ref 8.4–10.5)
Calcium: 9.4 mg/dL (ref 8.4–10.5)
Creatinine, Ser: 0.66 mg/dL (ref 0.4–1.2)
GFR calc Af Amer: >60 mL/min (ref >60)
Glucose, Bld: 78 mg/dL (ref 70–99)
Potassium: 3.4 mEq/L — ABNORMAL LOW (ref 3.5–5.1)
Sodium: 134 mEq/L — ABNORMAL LOW (ref 135–145)
Sodium: 136 mEq/L (ref 135–145)
Total Protein: 5.7 g/dL — ABNORMAL LOW (ref 6.0–8.3)
Total Protein: 6.8 g/dL (ref 6.0–8.3)

## 2010-10-06 LAB — BASIC METABOLIC PANEL
CO2: 22 mEq/L (ref 19–32)
Chloride: 110 mEq/L (ref 96–112)
GFR calc non Af Amer: >60 mL/min (ref >60)
Glucose, Bld: 71 mg/dL (ref 70–99)
Potassium: 3.6 mEq/L (ref 3.5–5.1)
Sodium: 136 mEq/L (ref 135–145)

## 2010-10-06 LAB — CBC
Hemoglobin: 12.7 g/dL (ref 12.0–15.0)
MCHC: 33.7 g/dL (ref 30.0–36.0)
Platelets: 264 10*3/uL (ref 150–400)
RDW: 12 % (ref 11.5–15.5)

## 2010-10-06 LAB — AMYLASE: Amylase: 111 U/L (ref 27–131)

## 2010-10-07 LAB — CBC
HCT: 35.5 % — ABNORMAL LOW (ref 36.0–46.0)
Hemoglobin: 12.1 g/dL (ref 12.0–15.0)
MCHC: 34.5 g/dL (ref 30.0–36.0)
MCV: 93.6 fL (ref 78.0–100.0)
MCV: 94.3 fL (ref 78.0–100.0)
RBC: 3.3 MIL/uL — ABNORMAL LOW (ref 3.87–5.11)
RDW: 12.6 % (ref 11.5–15.5)
RDW: 12.7 % (ref 11.5–15.5)

## 2010-11-04 NOTE — Op Note (Signed)
[name], [name] NO.:  [id number]   MEDICAL RECORD NO.:  [id number]          PATIENT TYPE:  AMB   LOCATION:  DSC                          FACILITY:  [facility]   PHYSICIAN:  Lubertha Basque. Dalldorf, M.D.DATE OF BIRTH:  1980-11-26   DATE OF PROCEDURE:  03/15/2007  DATE OF DISCHARGE:                               OPERATIVE REPORT   PREOPERATIVE DIAGNOSIS:  Right displaced distal radius fracture.   POSTOPERATIVE DIAGNOSIS:  Right displaced distal radius fracture.   PROCEDURE:  Open reduction and internal fixation right distal radius  fracture.   ANESTHESIA:  General.   ATTENDING SURGEON:  Lubertha Basque. Jerl Santos, M.D.   ASSISTANT:  Lindwood Qua, P.A.-C.   INDICATIONS FOR PROCEDURE:  The patient is a 30 year old woman who  injured her wrist a couple of days ago.  She has a displaced intra-  articular fracture and she is offered ORIF in hopes of re-establishing  the congruity of her wrist joint and allowing for early motion.  Informed operative consent was obtained after a discussion of the  possible complications of reaction to anesthesia, infection, and  neurovascular injury.   SUMMARY OF FINDINGS OF PROCEDURE:  Under general anesthesia through a  volar approach, an ORIF of the right distal radius was performed.  I  reduced the three major fragments appropriately and stabilized with the  [employer] DDR plate.  I used fluoroscopy throughout the case to  make appropriate intraoperative decisions and read all these views  myself.  [name], P.A.-C., assisted throughout and was invaluable  to the completion of the case in that he helped position and retract  while I performed the procedure.  He also closed simultaneously to help  minimize OR time.   DESCRIPTION OF PROCEDURE:  The patient was taken to the operating suite  where general anesthetic was applied without difficulty.  She was  positioned supine and prepped and draped in a normal sterile fashion.  After the administration of IV Kefzol, the right arm was elevated,  exsanguinated, and tourniquet inflated about the upper arm.  We placed  finger trap traction on the index and middle fingers with 10 pounds off  the end of the table.  I then made a small volar incision with  dissection down to the FCR tendon.  This was taken in a radial direction  with the neurovascular bundle.  I went through the base of the tendon  sheath to expose the distal radius.  The short muscles were retracted  off the bone.  We then performed an open reduction and I stabilized this  with the [employer] plate.  We used the standard size long plate.  We placed one screw proximally in the sliding hole and then moved this  to the appropriate position.  We then used all but one of the distal  holes with one partially threaded peg and the rest being smooth pegs.  I  then used two additional proximal holes with bicortical purchase in each  of these.  Fluoroscopy confirmed good placement of plate and reduction  of fracture.  The wound was  irrigated and the tourniquet was deflated.  A small amount of bleeding was easily controlled with Bovie cautery.  The traction was removed.  She good refill to the tips of her fingers  and a palpable radial pulse.  The wound was again irrigated followed by  reapproximation of the subcutaneous tissues with 2-0 undyed Vicryl and  the skin with nylon.  Marcaine was injected followed by Adaptic with a  dry gauze dressing and a volar splint of plaster with the wrist in  slight extension.  Estimated blood loss and intraoperative fluids can be  obtained from anesthesia records as can accurate tourniquet time.   DISPOSITION:  The patient was extubated in the operating room and taken  to the recovery room in stable addition.  She was to go home the same  day and follow up in the office in less than a week.  I will contact her  by phone tonight.      Lubertha Basque Jerl Santos, M.D.   Electronically Signed     PGD/MEDQ  D:  03/15/2007  T:  03/15/2007  Job:  [id number]

## 2010-11-04 NOTE — H&P (Signed)
[name], [name] NO.:  [id number]   MEDICAL RECORD NO.:  [id number]          PATIENT TYPE:  INP   LOCATION:  9151                          [facility]   PHYSICIAN:  [name], M.D.DATE OF BIRTH:  04-Oct-1980   DATE OF ADMISSION:  07/12/2008  DATE OF DISCHARGE:                              HISTORY & PHYSICAL   CHIEF COMPLAINT:  The patient is a 30 year old para 0 with an estimated  date of confinement of August 08, 2008, with an intrauterine pregnancy  at 36+ weeks complaining of nausea and vomiting as well as epigastric  pain.   HISTORY OF PRESENT ILLNESS:  The patient gives a 1-week history of the  above complaints.  She describes the emesis as greenish appearing.  She  reports normal bowel movements.  The patient's past medical and surgical  history is remarkable for multiple laparotomies for complications  related to typhoid fever in her native [place].  The most recent laparotomy  was performed in 2004 for a partial small-bowel obstruction.  She  underwent a right colectomy and extensive lysis of adhesions at that  point.   ALLERGIES:  No known drug allergies.   MEDICATIONS:  Please see the medication reconciliation form.   OBSTETRICS RISK FACTORS:  History of depression.  Please see the above  uterine fibroids.  GBS, bacteriuria, history of a positive PPD and  negative chest x-ray, incomplete INH prophylaxis.   PRENATAL LABORATORY DATA:  Blood type O+, antibody screen negative,  hemoglobin 13.7, hematocrit 37.6, platelets 234,000, rubella immune.  Hepatitis B surface antigen negative, RPR nonreactive, GC and chlamydia  probes negative, hemoglobin electrophoresis within normal limits.  Pap  smear negative, HIV nonreactive.  Urine culture and sensitivity 15,000  colonies per mL, GBS.   PAST OBSTETRICAL HISTORY:  There is a history of a spontaneous abortion.   PAST GYNECOLOGIC HISTORY:  There is a history of a D&C for an incomplete  abortion.  Please see the above.   PAST MEDICAL HISTORY:  Urinary tract infections, tension headaches,  chronic constipation.  Please see the above.   PAST SURGICAL HISTORY:  Please see the above.   SOCIAL HISTORY:  She is a Scientist, research (medical).  She is married, living with her  spouse.  Does not give any significant history of alcohol usage, has no  significant smoking history.  Denies illicit drug use.   FAMILY HISTORY:  Noncontributory.   REVIEW OF SYSTEMS:  GI:  Please see the above.  GU:  She denies  contractions, rupture of membranes, vaginal bleeding.   PHYSICAL EXAMINATION:  VITAL SIGNS:  Stable, afebrile.  Fetal heart  tracing reassuring.  Tocodynamometer; rare uterine contractions.  ABDOMEN:.  Gravid.  Normoactive bowel sounds, nondistended, nontender.  On sterile vaginal exam, the cervix is closed.   LABORATORY DATA:  Urinalysis; specific gravity 1.015, greater than 80  ketones.  PT 12.3, INR 0.9.  On CMET, it is a slightly hemolyzed  specimen; however, her SGOT was 78, SGPT 93.  CBC; white blood cell  count 7, hemoglobin 12.7.  Abdominal film retained colonic feces, normal  bowel gas pattern.   ASSESSMENT:  Intrauterine pregnancy at 36+ weeks with epigastric pain  and nausea and vomiting with a history of multiple laparotomies and  bowel resection, doubt an obstructive process at present, and the  differential diagnosis includes GERD, gastritis, also with elevated  LFTs, although the specimen was slightly hemolyzed consider--contraction  alkalosis in the setting of dehydration.   PLAN:  Admission, antacids, antiemetics, IV hydration, serial labs.  We  will also obtain an abdominal ultrasound.      [name], M.D.  Electronically Signed     LAJ/MEDQ  D:  07/13/2008  T:  07/13/2008  Job:  [id number]   cc:   Jeanette Charleston, MD

## 2010-11-04 NOTE — Discharge Summary (Signed)
[name], [name] NO.:  [id number]   MEDICAL RECORD NO.:  [id number]          PATIENT TYPE:  INP   LOCATION:  9151                          [facility]   PHYSICIAN:  Charles A. Clearance Coots, M.D.DATE OF BIRTH:  02-23-1981   DATE OF ADMISSION:  07/12/2008  DATE OF DISCHARGE:  07/15/2008                               DISCHARGE SUMMARY   ADMITTING DIAGNOSES:  1. A 36 plus weeks' gestation.  2. Epigastric pain.  3. Nausea.  4. Vomiting.  5. History of multiple laparotomies.  6. Bowel resection.   DISCHARGE DIAGNOSES:  1. A 36 plus weeks' gestation.  2. Epigastric pain.  3. Nausea.  4. Vomiting.  5. History of multiple laparotomies.  6. Bowel resection.   Much improved after IV fluid hydration and supportive management.   REASON FOR ADMISSION:  A 30 year old para 0 with estimated date of  confinement of August 08, 2008, who presents with nausea and vomiting  as well as epigastric pain.  The patient gives a 1-week history of above  complaints.  She describes emesis as greenish appearing.  She reports  normal bowel movements.  The patient's past medical and surgical history  is remarkable for multiple laparotomies for complications related to  typhoid fever in her native [place].  The most recent laparotomy was  performed in 2004 for partial small-bowel obstruction.  She underwent a  right colectomy and extensive lysis of adhesions at that point.   PAST MEDICAL HISTORY:  Surgery per history of present illness.  D and C  for incomplete abortion.  Illnesses urinary tract infections, tension  headaches, chronic constipation per history of present illness.   MEDICATIONS:  Prenatal vitamins.  Please see medication reconciliation  form for full medication list.   ALLERGIES:  No known drug allergies.   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  Negative  history of tobacco, alcohol or recreational drug use.   FAMILY HISTORY:   Noncontributory.   PHYSICAL EXAMINATION:  VITAL SIGNS:  Stable.  Afebrile.  LUNGS:  Clear to auscultation bilaterally.  HEART:  Regular rate and rhythm.  ABDOMEN:  Gravid and nontender.  Cervix is long and closed.   ADMITTING LABORATORIES:  Hemoglobin 12.7, hematocrit 37.7, white blood  cell count 7000, platelets 264,000.  Comprehensive metabolic panel was  remarkable for an AST of 78, ALT of 93, otherwise within normal limits.   HOSPITAL COURSE:  The patient was admitted and started on IV fluid  hydration and antiemetic and supportive therapy.  She responded well to  treatment and was ready for discharge by hospital day #3, able to  tolerate a diet and had no nausea, vomiting or abdominal pain.   DISCHARGE LABORATORIES:  AST was 58 and ALT was 79.   DISCHARGE DISPOSITION:  Medications, continue prenatal vitamins.  The  patient is to call office for followup appointment in 1 week.   DISCHARGE DIAGNOSES:  A 36 plus weeks' gestation.  Discharged home  undelivered, much improved after IV fluid hydration and supportive  management.  Discharged home in good condition.  Charles A. Clearance Coots, M.D.  Electronically Signed     CAH/MEDQ  D:  08/24/2008  T:  08/25/2008  Job:  [id number]

## 2010-11-07 NOTE — Op Note (Signed)
NAMETORIE, TOWLE NO.:  [id number]   MEDICAL RECORD NO.:  [id number]                   PATIENT TYPE:  INP   LOCATION:  0003                                 FACILITY:  [facility]   PHYSICIAN:  Velora Heckler, M.D.                DATE OF BIRTH:  [birth year]   DATE OF PROCEDURE:  05/08/2003  DATE OF DISCHARGE:                                 OPERATIVE REPORT   PREOPERATIVE DIAGNOSES:  1. Abdominal pain.  2. Probable internal hernia.  3. Probable partial small bowel obstruction.   POSTOPERATIVE DIAGNOSES:  1. Abdominal pain.  2. Internal hernia.  3. Partial small bowel obstruction.  4. Defunctionalized right colon.   PROCEDURES:  1. Right colectomy.  2. Lysis of adhesions, extensive.  3. Exploratory laparotomy.   SURGEON:  Velora Heckler, M.D.   ASSISTANT:  Rose Phi. Maple Hudson, M.D.   ANESTHESIA:  General.   ESTIMATED BLOOD LOSS:  150 mL.   PREPARATION:  Betadine.   COMPLICATIONS:  none.   INDICATIONS:  The patient is a 30 year old black female from [place], [place],  who presents with a four-month history of lower abdominal pain.  The patient  has a complex medical history, having undergone exploratory laparotomy for  complications of typhoid in [place] in May 2003.  She had a three-month  hospitalization with two further surgical interventions in the abdomen of  unknown type.  The patient presented at the maternity admissions at [facility] in September 2004 with abdominal pain.  She had an extensive  workup, including laboratory studies, abdominal ultrasound, and CT scan of  the abdomen.  CT scan of the abdomen showed a sac-like collection in the  right lower quadrant and pelvis.  This was felt to represent probable  internal hernia with partial small bowel obstruction.  The patient now comes  to surgery for exploratory laparotomy.   BODY OF REPORT:  The procedure was done in OR #11 at the [facility].  The patient is  brought to the operating room and placed  in a supine position on the operating room table.  Following administration  of general anesthesia, the patient is prepped and draped in the usual strict  aseptic fashion.  After ascertaining that an adequate level of anesthesia  had been obtained, the midline scar is excised sharply with a #10 blade.  Dissection is carried down to the fascia.  Fascia is incised in the midline  and the peritoneal cavity is entered cautiously.  There are dense adhesions  throughout the peritoneal cavity.  After considerable dissection, the small  bowel and sigmoid colon are identified.  The ligament of Treitz is  identified.  Lysis of adhesions is then begun at the ligament of Treitz and  following the small bowel distally.  An extensive lysis of adhesions is  carried out involving the entire small bowel, right colon, transverse  colon,  and sigmoid colon.  This takes approximately one hour of additional  operating time.  After extensive lysis of adhesions, the anatomy became more  apparent.  The patient had had an ileo-transverse colostomy performed in an  end-to-side fashion.  There was an internal hernia through the mesenteric  defect.  The right colon was defunctionalized and contained inspissated  stool.  It appeared viable.   A decision was made to resect the right colon up to the level of the ileo-  transverse colostomy anastomosis.  This would eliminate a potential source  of pain and eliminate the defunctionalized right colon and restore GI flow  in a more normal fashion.  Therefore, the right colon was mobilized from its  lateral peritoneal attachments.  Dense adhesions of the hepatic flexure to  the undersurface of the liver and gallbladder are taken down sharply.  Proximal transverse colon is mobilized over to the level of the anastomosis.  The mesentery is divided between [place] clamps, divided, and ligated with 2-0  silk ties.  The right colic artery is  doubly ligated with 2-0 silk ties.  Dissection is carried over to the level of the anastomosis in the mid-  transverse colon.  Transverse colon is then transected just proximal to the  anastomosis with a GIA-75 stapler.  The specimen was passed off the field  and submitted to pathology for review.  Good hemostasis is noted.  Further  small bowel lysis of adhesions is completed.  Sigmoid colon is mobilized off  of the fundus of the uterus with sharp dissection.  The abdomen is copiously  irrigated with warm saline and good hemostasis is achieved.  Small bowel is  returned to the peritoneal cavity.  The midline wound is closed with  interrupted #1 Novofil figure-of-eight sutures.  Subcutaneous tissues are  irrigated.  The skin edges are closed with stainless steel staples.  Sterile  dressings are applied.  The patient is awakened from anesthesia and brought  to the recovery room in stable condition.  The patient tolerated the  procedure well.                                               Velora Heckler, M.D.    TMG/MEDQ  D:  05/08/2003  T:  05/08/2003  Job:  [id number]   cc:   Hope Daviston, [facility] MAU   E[place] Key, [facility] MAU

## 2010-11-07 NOTE — Group Therapy Note (Signed)
NAME:  [name], [name] NO.:  [id number]   MEDICAL RECORD NO.:  [id number]                   PATIENT TYPE:  OUT   LOCATION:  [facility]                           FACILITY:  [facility]   PHYSICIAN:  Argentina Donovan, MD                     DATE OF BIRTH:  [birth year]   DATE OF SERVICE:  01/24/2004                                    CLINIC NOTE   The patient is a 30 year old nulligravida African female from [place], speaks  [place] only, who has had two years of infertility with a boyfriend.  She has  had somewhat irregular periods since menarche and two years ago in [place]  had some abdominal operation as a complications of typhoid fever.  Since she  was here she had a secondary problem with internal bowel obstruction  secondary to the primary surgery that she had in which right colectomy,  lysis of adhesions, extensive adhesions after exploratory laparotomy was  done.   PHYSICAL EXAMINATION:  ABDOMEN:  The patient's abdomen is covered with thick  keloid scars.  PELVIC:  External genitalia was normal.  Vagina is clean and well rugated.  Cervix is nulliparous and clean.  Pap smear was taken.  Uterus and adnexa  were normal.   PLAN:  Get a hysterosalpingogram in this patient and a semen analysis and  also putting her on a temperature chart.  We described in detail with her  and with her interpreter what is necessary.  My initial impression is that  she probably has significant pelvic adhesions that may be adding to the  problem of infertility even if we get her ovulating and have a partner who  is fertile.   IMPRESSION:  Primary infertility with abdominopelvic adhesions.                                               Argentina Donovan, MD    PR/MEDQ  D:  01/24/2004  T:  01/25/2004  Job:  [id number]

## 2010-11-07 NOTE — Discharge Summary (Signed)
[name], [name] NO.:  [id number]   MEDICAL RECORD NO.:  [id number]                   PATIENT TYPE:  INP   LOCATION:  0358                                 [facility]ACILITY:  [facility]   PHYSICIAN:  Velora Heckler, M.D.                DATE OF BIRTH:  1981-03-30   DATE OF ADMISSION:  05/08/2003  DATE OF DISCHARGE:  05/16/2003                                 DISCHARGE SUMMARY   REASON FOR ADMISSION:  Abdominal pain, internal hernia, partial small bowel  obstruction.   BRIEF HISTORY:  Patient is a 30 year old black female from [place], [place], who  presents with a four-month history of lower abdominal pain.  The patient has  a complex past medical and surgical history.  She had undergone exploratory  laparotomy for complications of typhoid in [place] in May of 2003.  She had a  three-month hospitalization with two further surgical interventions of  unknown type.  The patient presented in September of 2004 at [facility]  with abdominal pain.  She had an extensive workup including laboratory  studies, abdominal ultrasound, and CT scan of the abdomen.  This showed a  sac-like collection in the right lower quadrant and pelvis, felt to  represent internal hernia with partial small bowel obstruction.  The patient  is now admitted for exploration.   HOSPITAL COURSE:  The patient was admitted on May 08, 2003, and taken  directly to the operating room.  She underwent exploratory laparotomy,  extensive lysis of adhesions, a right colectomy.  Postoperatively the  patient had a prolonged ileus which gradually resolved.  She began  tolerating a clear liquid diet.  She was eventually advanced to a regular  diet.  She was ambulatory.  She had return of bowel function on the seventh  postoperative day.  She is prepared for discharge on the eight postoperative  day.   DISCHARGE PLANNING:  The patient will be discharged home today, May 16, 2003, in good condition,  tolerating a regular diet, and ambulating  independently.  The patient will be seen back in my office at [facility] in one week.   DISCHARGE MEDICATIONS:  1. Vicodin as needed for pain.  2. Phenergan as needed for nausea.   FINAL DIAGNOSES:  1. Partial small bowel obstruction.  2. Internal hernia.   CONDITION ON DISCHARGE:  Improved.                                               Velora Heckler, M.D.    TMG/MEDQ  D:  05/16/2003  T:  05/16/2003  Job:  [id number]

## 2010-11-07 NOTE — Group Therapy Note (Signed)
[name], GALLA NO.:  [id number]   MEDICAL RECORD NO.:  [id number]          PATIENT TYPE:  WOC   LOCATION:  [facility]                   FACILITY:  [facility]   PHYSICIAN:  Argentina Donovan, MD        DATE OF BIRTH:  1980/07/07   DATE OF SERVICE:  04/01/2004                                    CLINIC NOTE   The patient is a 30 year old nulligravida black female from [place] who has had many abdominal surgeries and infertility problems.  Has  had a problem with ovulation, irregular periods.  Also, had a  hysterosalpingogram which showed an occluded left fallopian tube with a less  than satisfactory flow from the right tube probably secondary to the massive  abdominal adhesions this lady must have.  In addition to that, the sperm  count showed increased agglutination with many abnormal sperm forms.  I have  referred the patient to the [facility]ro[facility] at [facility] as  I think if she has any chance of conceiving it would be through in vitro  fertilization.      PR/MEDQ  D:  04/01/2004  T:  04/02/2004  Job:  [id number]

## 2010-11-18 ENCOUNTER — Encounter: Payer: Self-pay | Admitting: Internal Medicine

## 2011-03-20 LAB — POCT URINALYSIS DIP (DEVICE)
Ketones, ur: NEGATIVE
Protein, ur: NEGATIVE
Specific Gravity, Urine: 1.02
pH: 6.5

## 2011-03-25 LAB — POCT URINALYSIS DIP (DEVICE)
Ketones, ur: NEGATIVE
Protein, ur: 30 — AB
Specific Gravity, Urine: 1.015
pH: 7

## 2011-06-12 ENCOUNTER — Emergency Department (HOSPITAL_COMMUNITY)
Admission: EM | Admit: 2011-06-12 | Discharge: 2011-06-12 | Disposition: A | Discharge status: Home / Self Care | Payer: Medicaid Other | Source: Home / Self Care

## 2011-06-12 ENCOUNTER — Encounter (HOSPITAL_COMMUNITY): Payer: Self-pay

## 2011-06-12 DIAGNOSIS — Z113 Encounter for screening for infections with a predominantly sexual mode of transmission: Secondary | ICD-10-CM

## 2011-06-12 DIAGNOSIS — N390 Urinary tract infection, site not specified: Secondary | ICD-10-CM

## 2011-06-12 LAB — POCT URINALYSIS DIP (DEVICE)
Bilirubin Urine: NEGATIVE
Ketones, ur: NEGATIVE mg/dL
pH: 6.5 (ref 5.0–8.0)

## 2011-06-12 LAB — WET PREP, GENITAL
Clue Cells Wet Prep HPF POC: NONE SEEN
Yeast Wet Prep HPF POC: NONE SEEN

## 2011-06-12 MED ORDER — IBUPROFEN 800 MG PO TABS: 800.0000 mg | ORAL_TABLET | Freq: Three times a day (TID) | ORAL | Status: DC

## 2011-06-12 MED ORDER — CEPHALEXIN 500 MG PO CAPS: 500.0000 mg | ORAL_CAPSULE | Freq: Two times a day (BID) | ORAL | Status: AC

## 2011-06-12 NOTE — ED Provider Notes (Signed)
History     CSN: [id number]  Arrival date & time 06/12/11  1720   None     Chief Complaint  Patient presents with  . Abdominal Pain    (Consider location/radiation/quality/duration/timing/severity/associated sxs/prior treatment) HPI Comments: Intermittent lower abd pain x one week, has been more constant last 4 days. Pain radiates to her lower back. Abd pain is crampy and lower back feels tight. She has mild dysuria, urgency and frequency. No current vaginal discharge but had some 1 mos ago and requests STD testing. Has felt feverish but has not checked her temp. BMs are regular - no constipation or diarrhea. Has taken Tylenol and provides some relief.    Past Medical History  Diagnosis Date  . 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  . Typhoid fever     hx of s/pabdominal  surgery for complication.  . Irregular menses     followed by Dr Okey Dupre and [facility] infertility clinic  . Depression   . 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 Abdominal surgery     2002 and revision in 2004    Family History  Problem Relation Age of Onset  . Cancer Maternal Aunt     breast cancer at age 14yrs    History  Substance Use Topics  . Smoking status: Never Smoker   . Smokeless tobacco: Not on file  . Alcohol Use: Yes     occasional    OB History    Grav Para Term Preterm Abortions TAB SAB Ect Mult Living   2 2              Review of Systems  Constitutional: Positive for chills.  Respiratory: Negative for cough and shortness of breath.   Cardiovascular: Negative for chest pain.  Gastrointestinal: Positive for abdominal pain. Negative for vomiting, diarrhea and constipation.  Genitourinary: Positive for dysuria, urgency and frequency. Negative for vaginal bleeding and vaginal discharge.  Musculoskeletal: Positive for back pain.    Allergies  Review of patient's  allergies indicates no known allergies.  Home Medications   Current Outpatient Rx  Name Route Sig Dispense Refill  . CEPHALEXIN 500 MG PO CAPS Oral Take 1 capsule (500 mg total) by mouth 2 (two) times daily. 14 capsule 0  . IBUPROFEN 800 MG PO TABS Oral Take 1 tablet (800 mg total) by mouth 3 (three) times daily. 12 tablet 0  . SCAR GEL EX GEL Apply externally Apply topically. Apply to affected area 2-3 times a day.       BP 108/61  Pulse 63  Temp(Src) 98.9 F (37.2 C) (Oral)  Resp 18  SpO2 100%  LMP 05/29/2011  Physical Exam  Nursing note and vitals reviewed. Cardiovascular: Normal rate, regular rhythm and normal heart sounds.   Pulmonary/Chest: Effort normal and breath sounds normal. No respiratory distress.  Abdominal: Soft. Bowel sounds are normal. She exhibits no distension and no mass. There is no tenderness. There is no guarding.    Genitourinary: Vagina normal and uterus normal. There is no rash, tenderness, lesion or injury on the right labia. There is no rash, tenderness, lesion or injury on the left labia. Cervix exhibits no motion tenderness, no discharge and no friability. Right adnexum displays no mass, no tenderness and no fullness. Left adnexum displays no mass, no tenderness and no fullness.  Neurological: She is alert.  Skin: Skin is warm and dry.  Psychiatric: She has a normal mood and affect.    ED Course  Procedures (including critical care time)  Labs Reviewed  POCT URINALYSIS DIP (DEVICE) - Abnormal; Notable for the following:    Hgb urine dipstick SMALL (*)    Protein, ur 30 (*)    Leukocytes, UA MODERATE (*) Biochemical Testing Only. Please order routine urinalysis from main lab if confirmatory testing is needed.   All other components within normal limits  POCT PREGNANCY, URINE  POCT URINALYSIS DIPSTICK  POCT PREGNANCY, URINE  GC/CHLAMYDIA PROBE AMP, GENITAL  WET PREP, GENITAL   No results found.   1. UTI (urinary tract infection)   2.  Screen for STD (sexually transmitted disease)       MDM   UA pos for UTI       Melody Comas, [place] 06/12/11 1859

## 2011-06-12 NOTE — ED Notes (Signed)
States she has had pain this past week lower abdominal area and into her low back, feels as if her period is about to start (LMP 2 week ago) sexually active w/o BC; had vaginal d/c 1 month ago before she went to [place], but none since then ; concern for poss STD?

## 2011-06-13 LAB — GC/CHLAMYDIA PROBE AMP, GENITAL
Chlamydia, DNA Probe: NEGATIVE
GC Probe Amp, Genital: NEGATIVE

## 2011-06-13 NOTE — ED Provider Notes (Signed)
Medical screening examination/treatment/procedure(s) were performed by non-physician practitioner and as supervising physician I was immediately available for consultation/collaboration.   [facility]; MD   Sharin Grave, MD 06/13/11 [phone]

## 2011-06-14 ENCOUNTER — Emergency Department (HOSPITAL_COMMUNITY)
Admission: EM | Admit: 2011-06-14 | Discharge: 2011-06-14 | Disposition: A | Discharge status: Home / Self Care | Payer: Medicaid Other | Attending: Emergency Medicine | Admitting: Emergency Medicine

## 2011-06-14 ENCOUNTER — Encounter (HOSPITAL_COMMUNITY): Payer: Self-pay | Admitting: *Deleted

## 2011-06-14 DIAGNOSIS — R1013 Epigastric pain: Secondary | ICD-10-CM

## 2011-06-14 LAB — URINALYSIS, ROUTINE W REFLEX MICROSCOPIC
Bilirubin Urine: NEGATIVE
Glucose, UA: NEGATIVE mg/dL
Hgb urine dipstick: NEGATIVE
Protein, ur: NEGATIVE mg/dL
Urobilinogen, UA: 0.2 mg/dL (ref 0.0–1.0)

## 2011-06-14 LAB — COMPREHENSIVE METABOLIC PANEL
Alkaline Phosphatase: 107 U/L (ref 39–117)
BUN: 11 mg/dL (ref 6–23)
Chloride: 103 mEq/L (ref 96–112)
Creatinine, Ser: 0.6 mg/dL (ref 0.50–1.10)
GFR calc Af Amer: >90 mL/min (ref >90)
Glucose, Bld: 62 mg/dL — ABNORMAL LOW (ref 70–99)
Potassium: 3.8 mEq/L (ref 3.5–5.1)
Total Bilirubin: 0.3 mg/dL (ref 0.3–1.2)

## 2011-06-14 LAB — CBC
HCT: 36 % (ref 36.0–46.0)
Hemoglobin: 12.5 g/dL (ref 12.0–15.0)
MCHC: 34.7 g/dL (ref 30.0–36.0)
MCV: 87.6 fL (ref 78.0–100.0)
WBC: 4.3 10*3/uL (ref 4.0–10.5)

## 2011-06-14 LAB — LIPASE, BLOOD: Lipase: 26 U/L (ref 11–59)

## 2011-06-14 LAB — POCT PREGNANCY, URINE: Preg Test, Ur: NEGATIVE

## 2011-06-14 MED ORDER — GI COCKTAIL ~~LOC~~
30.0000 mL | Freq: Once | ORAL | Status: AC
  Administered 2011-06-14: 30 mL via ORAL
  Filled 2011-06-14: qty 30

## 2011-06-14 MED ORDER — OXYCODONE-ACETAMINOPHEN 5-325 MG PO TABS: 1.0000 | ORAL_TABLET | Freq: Four times a day (QID) | ORAL | Status: AC | PRN

## 2011-06-14 MED ORDER — ONDANSETRON HCL 4 MG PO TABS: 4.0000 mg | ORAL_TABLET | Freq: Four times a day (QID) | ORAL | Status: AC

## 2011-06-14 NOTE — ED Notes (Signed)
Reports going to ucc for abd pain that radiates around to her flank. Was prescribed keflex and ibuprofen and reports having heartburn and epigastric pain since starting the meds. No acute distress ntoed at triage.

## 2011-06-14 NOTE — ED Provider Notes (Signed)
History     CSN: [id number]  Arrival date & time 06/14/11  1159   First MD Initiated Contact with Patient 06/14/11 1308      Chief Complaint  Patient presents with  . Abdominal Pain    (Consider location/radiation/quality/duration/timing/severity/associated sxs/prior treatment) The history is provided by the patient.    Pt presents to the ED with complaints of epigastric pain. Pt was seen at the Urgent Care and given Keflex and Ibuprofen for the pain on Friday for a UTI. Since starting the medication she has been having the epigastric pain. Denies nausea, denies vomiting, weakness, fevers chills. The pain does not change with eating. She has taken both medications before without any problems.   Past Medical History  Diagnosis Date  . 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  . Typhoid fever     hx of s/pabdominal  surgery for complication.  . Irregular menses     followed by Dr Okey Dupre and [facility] infertility clinic  . Depression   . 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 Abdominal surgery     2002 and revision in 2004    Family History  Problem Relation Age of Onset  . Cancer Maternal Aunt     breast cancer at age 38yrs    History  Substance Use Topics  . Smoking status: Never Smoker   . Smokeless tobacco: Not on file  . Alcohol Use: Yes     occasional    OB History    Grav Para Term Preterm Abortions TAB SAB Ect Mult Living   2 2              Review of Systems  All other systems reviewed and are negative.    Allergies  Review of patient's allergies indicates no known allergies.  Home Medications   Current Outpatient Rx  Name Route Sig Dispense Refill  . CEPHALEXIN 500 MG PO CAPS Oral Take 1 capsule (500 mg total) by mouth 2 (two) times daily. 14 capsule 0  . ONDANSETRON HCL 4 MG PO TABS Oral Take 1 tablet (4 mg total) by mouth every 6  (six) hours. 12 tablet 0  . OXYCODONE-ACETAMINOPHEN 5-325 MG PO TABS Oral Take 1 tablet by mouth every 6 (six) hours as needed for pain. 10 tablet 0    BP 110/76  Pulse 60  Temp(Src) 97.8 F (36.6 C) (Oral)  Resp 18  SpO2 100%  LMP 05/29/2011  Physical Exam  Nursing note and vitals reviewed. Constitutional: She is oriented to person, place, and time. She appears well-developed and well-nourished. No distress.  HENT:  Head: Normocephalic and atraumatic.  Eyes: Conjunctivae are normal. Pupils are equal, round, and reactive to light.  Neck: Trachea normal, normal range of motion and full passive range of motion without pain. Neck supple. No tracheal deviation present.  Cardiovascular: Normal rate, regular rhythm and normal pulses.   Pulmonary/Chest: Effort normal and breath sounds normal. Chest wall is not dull to percussion. She exhibits no tenderness, no crepitus, no edema, no deformity and no retraction.  Abdominal: Soft. Normal appearance and bowel sounds are normal. She exhibits no distension and no mass. There is Tenderness: no tenderness to palpation of epigastric area the tenderness is felt internally.. There is no rebound and no guarding.  Musculoskeletal: Normal range of motion.  Neurological: She is oriented to person, place,  and time. She has normal strength.  Skin: Skin is warm, dry and intact. She is not diaphoretic.  Psychiatric: She has a normal mood and affect. Her speech is normal and behavior is normal. Judgment and thought content normal. Cognition and memory are normal.    ED Course  Procedures (including critical care time)  Labs Reviewed  COMPREHENSIVE METABOLIC PANEL - Abnormal; Notable for the following:    Glucose, Bld 62 (*)    ALT 36 (*)    All other components within normal limits  URINALYSIS, ROUTINE W REFLEX MICROSCOPIC  LIPASE, BLOOD  CBC  POCT PREGNANCY, URINE  LAB REPORT - SCANNED   No results found.   1. Epigastric pain       MDM    Will advise patient to discontinue Ibuprofen and take Tylenol instead as ibuprofen can cause upset stomach.        Dorthula Matas, PA 06/15/11 [phone]

## 2011-06-16 NOTE — ED Provider Notes (Signed)
Medical screening examination/treatment/procedure(s) were performed by non-physician practitioner and as supervising physician I was immediately available for consultation/collaboration.   Shelda Jakes, MD 06/16/11 [phone]

## 2011-06-27 ENCOUNTER — Emergency Department (HOSPITAL_COMMUNITY)
Admission: EM | Admit: 2011-06-27 | Discharge: 2011-06-27 | Disposition: A | Discharge status: Home / Self Care | Payer: Medicaid Other | Source: Home / Self Care | Attending: Family Medicine | Admitting: Family Medicine

## 2011-06-27 ENCOUNTER — Encounter (HOSPITAL_COMMUNITY): Payer: Self-pay | Admitting: *Deleted

## 2011-06-27 DIAGNOSIS — N3090 Cystitis, unspecified without hematuria: Secondary | ICD-10-CM

## 2011-06-27 HISTORY — DX: Urinary tract infection, site not specified: N39.0

## 2011-06-27 LAB — POCT PREGNANCY, URINE: Preg Test, Ur: NEGATIVE

## 2011-06-27 LAB — POCT URINALYSIS DIP (DEVICE)
Glucose, UA: NEGATIVE mg/dL
Leukocytes, UA: NEGATIVE
Nitrite: NEGATIVE
Protein, ur: NEGATIVE mg/dL
Urobilinogen, UA: 0.2 mg/dL (ref 0.0–1.0)

## 2011-06-27 MED ORDER — SULFAMETHOXAZOLE-TRIMETHOPRIM 800-160 MG PO TABS: 1.0000 | ORAL_TABLET | Freq: Two times a day (BID) | ORAL | Status: AC

## 2011-06-27 NOTE — ED Notes (Addendum)
C/O suprapubic pain wrapping around to right low back since last night.  Pain is same as pain she had when she had UTI dx on 12/21; finished cephalexin yesterday (was not taking regularly "because I sometimes forgot, and I was fasting over the holiday".  c/o dysuria.  Denies fevers.  Denies n/v.

## 2011-06-27 NOTE — ED Provider Notes (Signed)
History     CSN: [id number]  Arrival date & time 06/27/11  0915   First MD Initiated Contact with Patient 06/27/11 [phone]      Chief Complaint  Patient presents with  . Abdominal Pain  . Back Pain    (Consider location/radiation/quality/duration/timing/severity/associated sxs/prior treatment) HPI Comments: [name] presents for evaluation of RIGHT sided lower abdominal pain that wraps around to her RIGHT lower back. She also reports suprapubic pressure and pain with urination, urinary urgency, frequency. She denies any fever, nausea, or vomiting. She reports similar sx with UTI dx on 12/21. She reports taking her antibiotic regimen inconsistently secondary to a religious fast over the holidays.   Patient is a 31 y.o. female presenting with abdominal pain.  Abdominal Pain The primary symptoms of the illness include abdominal pain. The primary symptoms of the illness do not include fever. The current episode started yesterday. The onset of the illness was sudden. The problem has not changed since onset. The illness is associated with recent antibiotic use. The patient states that she believes she is currently not pregnant. The patient has not had a change in bowel habit. Additional symptoms associated with the illness include urgency, frequency and back pain. Symptoms associated with the illness do not include chills or hematuria.    Past Medical History  Diagnosis Date  . 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  . Typhoid fever     hx of s/pabdominal  surgery for complication.  . Irregular menses     followed by Dr Okey Dupre and [facility] infertility clinic  . Depression   . 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 UTI (lower urinary tract infection)     Past Surgical History  Procedure Date  . Abdominal surgery     R/T typhoid fever 2002 and revision in 2004    Family History  Problem Relation Age of Onset  .  Cancer Maternal Aunt     breast cancer at age 55yrs    History  Substance Use Topics  . Smoking status: Never Smoker   . Smokeless tobacco: Not on file  . Alcohol Use: Yes     occasional    OB History    Grav Para Term Preterm Abortions TAB SAB Ect Mult Living   2 2              Review of Systems  Constitutional: Negative for fever and chills.  HENT: Negative.   Eyes: Negative.   Respiratory: Negative.   Cardiovascular: Negative.   Gastrointestinal: Positive for abdominal pain.  Genitourinary: Positive for urgency and frequency. Negative for hematuria.  Musculoskeletal: Positive for back pain.    Allergies  Ibuprofen  Home Medications   Current Outpatient Rx  Name Route Sig Dispense Refill  . SULFAMETHOXAZOLE-TRIMETHOPRIM 800-160 MG PO TABS Oral Take 1 tablet by mouth 2 (two) times daily. 10 tablet 0    BP 106/73  Pulse 64  Temp(Src) 98 F (36.7 C) (Oral)  Resp 17  SpO2 97%  LMP 06/08/2011  Physical Exam  Nursing note and vitals reviewed. Constitutional: She is oriented to person, place, and time. She appears well-developed and well-nourished.  HENT:  Head: Normocephalic and atraumatic.  Eyes: EOM are normal.  Neck: Normal range of motion.  Pulmonary/Chest: Effort normal.  Abdominal: Soft. Bowel sounds are normal. There is tenderness in the right lower quadrant. There is no tenderness at McBurney's point.  Musculoskeletal: Normal range of motion.       Lumbar back: She exhibits no tenderness and no pain.       Back:  Neurological: She is alert and oriented to person, place, and time.  Skin: Skin is warm and dry.  Psychiatric: Her behavior is normal.    ED Course  Procedures (including critical care time)   Labs Reviewed  POCT URINALYSIS DIP (DEVICE)  POCT PREGNANCY, URINE  POCT PREGNANCY, URINE  POCT URINALYSIS DIPSTICK  URINE CULTURE   No results found.   1. Cystitis       MDM  Likely partially treated UTI/cystitis. Will send  urine for culture and follow; rx for TMP-SMX DS given x 5 days; follow up if no better.        Richardo Priest, MD 06/27/11 1028

## 2011-07-04 ENCOUNTER — Emergency Department (HOSPITAL_COMMUNITY)
Admission: EM | Admit: 2011-07-04 | Discharge: 2011-07-04 | Disposition: A | Discharge status: Home / Self Care | Payer: Medicaid Other | Attending: Emergency Medicine | Admitting: Emergency Medicine

## 2011-07-04 ENCOUNTER — Encounter (HOSPITAL_COMMUNITY): Payer: Self-pay | Admitting: Emergency Medicine

## 2011-07-04 DIAGNOSIS — N898 Other specified noninflammatory disorders of vagina: Secondary | ICD-10-CM | POA: Insufficient documentation

## 2011-07-04 DIAGNOSIS — R109 Unspecified abdominal pain: Secondary | ICD-10-CM | POA: Insufficient documentation

## 2011-07-04 DIAGNOSIS — N926 Irregular menstruation, unspecified: Secondary | ICD-10-CM | POA: Insufficient documentation

## 2011-07-04 DIAGNOSIS — N920 Excessive and frequent menstruation with regular cycle: Secondary | ICD-10-CM | POA: Insufficient documentation

## 2011-07-04 LAB — URINALYSIS, ROUTINE W REFLEX MICROSCOPIC
Glucose, UA: NEGATIVE mg/dL
Protein, ur: NEGATIVE mg/dL
Specific Gravity, Urine: 1.025 (ref 1.005–1.030)
pH: 6 (ref 5.0–8.0)

## 2011-07-04 LAB — WET PREP, GENITAL
Clue Cells Wet Prep HPF POC: NONE SEEN
Yeast Wet Prep HPF POC: NONE SEEN

## 2011-07-04 LAB — URINE MICROSCOPIC-ADD ON

## 2011-07-04 LAB — PREGNANCY, URINE: Preg Test, Ur: NEGATIVE

## 2011-07-04 NOTE — ED Provider Notes (Signed)
Medical screening examination/treatment/procedure(s) were performed by non-physician practitioner and as supervising physician I was immediately available for consultation/collaboration.  Loren Racer, MD 07/04/11 2320

## 2011-07-04 NOTE — ED Provider Notes (Signed)
History     CSN: [id number]  Arrival date & time 07/04/11  1258   First MD Initiated Contact with Patient 07/04/11 1507      Chief Complaint  Patient presents with  . Vaginal Bleeding    (Consider location/radiation/quality/duration/timing/severity/associated sxs/prior treatment) HPI History provided by pt.   Pt has chronically irregular periods.  Had two in October, one in mid-December and then started again yesterday.  Bleed heavily early this morning.  Changed her pad at 4am and it was completely saturated and overflowing at 10am.  No h/o menorrhagia.  No associated fever, cramping, N/V or any other GU sx.  No lightheadedness, syncope or SOB.  Had intermittent and brief mild pains in lower abd on Monday and Tuesday of this week, usually on the left but occasionally on the right.    Past Medical History  Diagnosis Date  . 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  . Typhoid fever     hx of s/pabdominal  surgery for complication.  . Irregular menses     followed by Dr Okey Dupre and [facility] infertility clinic  . Depression   . 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 UTI (lower urinary tract infection)     Past Surgical History  Procedure Date  . Abdominal surgery     R/T typhoid fever 2002 and revision in 2004    Family History  Problem Relation Age of Onset  . Cancer Maternal Aunt     breast cancer at age 12yrs    History  Substance Use Topics  . Smoking status: Never Smoker   . Smokeless tobacco: Not on file  . Alcohol Use: Yes     occasional    OB History    Grav Para Term Preterm Abortions TAB SAB Ect Mult Living   2 2              Review of Systems  All other systems reviewed and are negative.    Allergies  Ibuprofen  Home Medications   Current Outpatient Rx  Name Route Sig Dispense Refill  . ACETAMINOPHEN 325 MG PO TABS Oral Take 650 mg by mouth every 6 (six) hours as needed.  For pain.      BP 111/62  Pulse 74  Temp(Src) 98 F (36.7 C) (Oral)  Resp 16  SpO2 98%  LMP 06/08/2011  Physical Exam  Nursing note and vitals reviewed. Constitutional: She is oriented to person, place, and time. She appears well-developed and well-nourished. No distress.  HENT:  Head: Normocephalic and atraumatic.  Eyes:       Pink conjunctiva  Neck: Normal range of motion.  Cardiovascular: Normal rate.   Pulmonary/Chest: Effort normal and breath sounds normal.  Abdominal: Soft. Bowel sounds are normal. She exhibits no distension.       Mild, diffuse lower abd ttp  Genitourinary:       Nml external genitalia.  Vaginal bleeding.  Cervix closed and appears nml.  Right adnexal and cervical motion tenderness reported but pt does not appear at all uncomfortable.   Neurological: She is alert and oriented to person, place, and time.  Psychiatric: She has a normal mood and affect. Her behavior is normal.    ED Course  Procedures (including critical care time)  Labs Reviewed  URINALYSIS, ROUTINE W REFLEX MICROSCOPIC - Abnormal; Notable for the following:    APPearance CLOUDY (*)    Hgb urine  dipstick LARGE (*)    Leukocytes, UA TRACE (*)    All other components within normal limits  URINE MICROSCOPIC-ADD ON - Abnormal; Notable for the following:    Squamous Epithelial / LPF FEW (*)    All other components within normal limits  PREGNANCY, URINE  WET PREP, GENITAL  GC/CHLAMYDIA PROBE AMP, GENITAL   No results found.   1. Menorrhagia       MDM  Pt presents w/ c/o menorrhagia.  No prior history.  No associated pelvic pain.  No s/sx suggestive of anemia.  Mild vaginal bleeding on pelvic exam and right adnexal and cervical motion tenderness reported but pt does not appear uncomfortable.  Diffuse, mild abd ttp initially but on re-examination, pain localized to right suprapubic/RLQ (again, pt does not appear uncomfortable).  U/A, urine preg and wet prep neg.  All results as well  as possible causes of menorrhagia discussed w/ pt.  D/c'd home w/ referral to [facility] and Gyn.  Return precautions discussed.        Arie Sabina [place], [place] 07/04/11 1635

## 2011-07-04 NOTE — ED Notes (Signed)
Pt. Stated, I'm bleeding a lot for my period

## 2011-07-04 NOTE — ED Notes (Signed)
Pt states last menses was dec 17. Today awoke with vaginal bleeding heavier then normal. Denies abd pain at this time or any other complaints

## 2011-08-19 ENCOUNTER — Encounter: Payer: Self-pay | Admitting: Obstetrics and Gynecology

## 2011-08-19 ENCOUNTER — Ambulatory Visit (INDEPENDENT_AMBULATORY_CARE_PROVIDER_SITE_OTHER): Payer: Medicaid Other | Admitting: Obstetrics and Gynecology

## 2011-08-19 ENCOUNTER — Other Ambulatory Visit (HOSPITAL_COMMUNITY)
Admission: RE | Admit: 2011-08-19 | Discharge: 2011-08-19 | Disposition: A | Discharge status: Home / Self Care | Payer: Medicaid Other | Source: Ambulatory Visit | Attending: Obstetrics and Gynecology | Admitting: Obstetrics and Gynecology

## 2011-08-19 VITALS — BP 105/73 | HR 71 | Temp 97.8°F | Resp 16 | Ht 63.0 in | Wt 168.8 lb

## 2011-08-19 DIAGNOSIS — N921 Excessive and frequent menstruation with irregular cycle: Secondary | ICD-10-CM

## 2011-08-19 DIAGNOSIS — R300 Dysuria: Secondary | ICD-10-CM

## 2011-08-19 DIAGNOSIS — Z01812 Encounter for preprocedural laboratory examination: Secondary | ICD-10-CM

## 2011-08-19 DIAGNOSIS — Z01419 Encounter for gynecological examination (general) (routine) without abnormal findings: Secondary | ICD-10-CM | POA: Insufficient documentation

## 2011-08-19 DIAGNOSIS — Z124 Encounter for screening for malignant neoplasm of cervix: Secondary | ICD-10-CM

## 2011-08-19 LAB — POCT URINALYSIS DIP (DEVICE)
Glucose, UA: NEGATIVE mg/dL
Leukocytes, UA: NEGATIVE
Nitrite: NEGATIVE
Protein, ur: NEGATIVE mg/dL
Urobilinogen, UA: 0.2 mg/dL (ref 0.0–1.0)

## 2011-08-19 LAB — POCT PREGNANCY, URINE: Preg Test, Ur: NEGATIVE

## 2011-08-19 MED ORDER — NORGESTIMATE-ETH ESTRADIOL 0.25-35 MG-MCG PO TABS: 1.0000 | ORAL_TABLET | Freq: Every day | ORAL | Status: DC

## 2011-08-19 NOTE — Patient Instructions (Signed)
Nice to meet you! Will start on hormone birth control to regulate menstrual cycles. IF this does not work, will discuss other options. Make an appointment in 1-2 months for follow up.  Metrorrhagia  Metrorrhagia is uterine bleeding at irregular intervals, especially between menstrual periods.  CAUSES   Dysfunctional uterine bleeding.   Uterine lining growing outside the uterus (endometriosis).   Embryo adhering to uterine wall (implantation).   Pregnancy growing in the fallopian tubes (ectopic pregnancy).   Miscarriage.   Menopause.   Cancer of the reproduction organs.   Certain drugs such as hormonal contraceptives.   Inherited bleeding disorders.   Trauma.   Uterine fibroids.   Sexually transmitted diseases (STDs).   Polycystic ovarian disease.  DIAGNOSIS  A history will be taken.   A physical exam will be performed.   Other tests may include:   Blood tests.   A pregnancy test.   An ultrasound of the abdomen and pelvis.   A biopsy of the uterine lining.   AMRI or CT scan of the abdomen and pelvis.  TREATMENT Treatment will depend on the cause. HOME CARE INSTRUCTIONS   Take all medicines as directed by your caregiver. Do not change or switch medicines without talking to your caregiver.   Take all iron supplements exactly as directed by your caregiver. Iron supplements help to replace the iron your body loses from irregular bleeding.If you become constipated, increase the amount of fiber, fruits, and vegetables in your diet.   Do not take aspirin or medicines that contain aspirin for 1 week before your menstrual period or during your menstrual period. Aspirin may increase the bleeding.   Rest as much as possible if you change your sanitary pad or tampon more than once every 2 hours.   Eat well-balanced meals including foods high in iron, such as green leafy vegetables, red meat, liver, eggs, and whole-grain breads and cereals.   Do not try to lose  weight until the abnormal bleeding is controlled and your blood iron level is back to normal.  SEEK MEDICAL CARE IF:   You have nausea and vomiting, or you cannot keep foods down.   You feel dizzy or have diarrhea while taking medicine.   You have any problems that may be related to the medicine you are taking.  SEEK IMMEDIATE MEDICAL CARE IF:   You have a fever.   You develop chills.   You become lightheaded or faint.   You need to change your sanitary pad or tampon more than once an hour.   Your bleeding becomesheavy.   You begin to pass clots or tissue.  MAKE SURE YOU:   Understand these instructions.   Will watch your condition.   Will get help right away if you are not doing well or get worse.  Document Released: 06/08/2005 Document Revised: 02/18/2011 Document Reviewed: 01/05/2011 [facility] Patient Information 2012 [place], [place].

## 2011-08-19 NOTE — Progress Notes (Signed)
  Subjective:    Patient ID: [name], female    DOB: 01/20/1981, 31 y.o.   MRN: [id number]  HPI  1. Irregular menstrual bleeding. Visit to ED last month. Patient has hx of infertility but delivered SVD October 2011. Breastfed x 1year. In Sept/Oct 2012 noted irregular menstrual intervals, ranging from 13-40 days. Associated menstrual cramping. LMP was 2/17 this month with similar cramping. Never has tried any OCPs. Never smoker. No imaging done in ED visit, but negative cervical cultures.  Hx of typhoid with abdominal surgery in 2002. Hysterogram via [facility] infertility MD showing blocked left tube. Due for pap.   Review of Systems Has some suprapubic discomfort and intermittent dysuria. Denies fevers, abdominal pain, nausea, vomiting, edema.     Objective:   Physical Exam  Vitals reviewed. Constitutional: She is oriented to person, place, and time. She appears well-developed and well-nourished. No distress.  HENT:  Head: Normocephalic and atraumatic.  Eyes: Pupils are equal, round, and reactive to light.  Abdominal: Soft. She exhibits no distension and no mass. There is no tenderness. There is no rebound and no guarding.  Genitourinary: Vagina normal and uterus normal. No vaginal discharge found.       No cervical motion tenderness or uterine abnormalities palpated.   Musculoskeletal: She exhibits no edema.  Neurological: She is alert and oriented to person, place, and time.  Skin: She is not diaphoretic.  Psychiatric: She has a normal mood and affect.   Urinalysis negative today.     Assessment & Plan:   Metrorrhagia. Patient has history of irregular cycles and infertility. Likely having anovulatory bleeding currently, just finished with breastfeeding. Discussed attempt to regulate cycles with OCPs. F/u in 1-2 months. If abnormalities persist, consider pelvic US and possible need for endometrial biopsy though patient does not have any risk factors for endometrial cancer  besides mild obesity. Pap done today.   1. Pre-procedure lab exam  Cytology - PAP   Saw pt and agree with above note. POE,DEIRDRE

## 2011-08-19 NOTE — Progress Notes (Signed)
UPT - Negative

## 2011-09-16 ENCOUNTER — Encounter: Payer: Self-pay | Admitting: Obstetrics and Gynecology

## 2011-09-16 ENCOUNTER — Other Ambulatory Visit: Payer: Self-pay | Admitting: Obstetrics and Gynecology

## 2011-09-16 ENCOUNTER — Ambulatory Visit (INDEPENDENT_AMBULATORY_CARE_PROVIDER_SITE_OTHER): Payer: Medicaid Other | Admitting: Obstetrics and Gynecology

## 2011-09-16 VITALS — BP 108/69 | HR 75 | Temp 97.1°F | Resp 20 | Ht 63.0 in | Wt 170.6 lb

## 2011-09-16 DIAGNOSIS — N949 Unspecified condition associated with female genital organs and menstrual cycle: Secondary | ICD-10-CM

## 2011-09-16 DIAGNOSIS — R102 Pelvic and perineal pain: Secondary | ICD-10-CM

## 2011-09-16 DIAGNOSIS — A0100 Typhoid fever, unspecified: Secondary | ICD-10-CM

## 2011-09-16 NOTE — Patient Instructions (Signed)

## 2011-09-16 NOTE — Progress Notes (Signed)
Pt states she did not want to take the OCP's that were prescribed @ her last visit. She wants to know other options to control her bleeding.

## 2011-09-16 NOTE — Progress Notes (Signed)
[name] [id number] Chief Complaint  Patient presents with  . Follow-up    SUBJECTIVE  HPI: Presents in followup of visit 1 month ago here where she was prescribed Sprintec due to some menstrual irregularity since November when she stopped breast-feeding as well as dysmenorrhea. She decided not to start the Sprintec due to concern about possible side effects of any type of birth control. She does use condoms at the time of ovulation. LMP 09/08/2011 PMP 08/08/2011. Denies inter-menstrual spotting. She states her flow is heavy and she has dysmenorrhea. She is concerned that she's been having pain over her symphysis pubic bone and throughout the lower abdomen intermittently times several months. She notices it particularly with urination denies burning on urination and states the pain is better over the past 2 months. Bowels regular and no effect on pain. Of note she had resection into ago and also here in 2004. Due to typhoid complications. She's also had multiple abdominal laparoscopies. She also has dyspareunia and a burning sensation in her vagina at the time of ejaculation. Denies irritative dischargeor increase in amount of vag discharge   Past Medical History  Diagnosis Date  . 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  . Typhoid fever     hx of s/pabdominal  surgery for complication.  . Irregular menses     followed by Dr Okey Dupre and [facility] infertility clinic  . Depression   . 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 UTI (lower urinary tract infection)    Past Surgical History  Procedure Date  . Abdominal surgery     R/T typhoid fever 2002 and revision in 2004   History   Social History  . Marital Status: Married    Spouse Name: N/A    Number of Children: N/A  . Years of Education: N/A   Occupational History  . Not on file.   Social History Main Topics  . Smoking status: Never Smoker   .  Smokeless tobacco: Not on file  . Alcohol Use: No  . Drug Use: No  . Sexually Active: Yes    Birth Control/ Protection: Condom, Rhythm   Other Topics Concern  . Not on file   Social History Narrative   Financial assistance approved for 100% discount at [facility] and has [facility] card, Rudell Cobb Aug 02, 2009.   Current Outpatient Prescriptions on File Prior to Visit  Medication Sig Dispense Refill  . acetaminophen (TYLENOL) 325 MG tablet Take 650 mg by mouth every 6 (six) hours as needed. For pain.      . norgestimate-ethinyl estradiol (SPRINTEC 28) 0.25-35 MG-MCG tablet Take 1 tablet by mouth daily.  1 Package  5   Allergies  Allergen Reactions  . Ibuprofen Other (See Comments)    Chest pain    ROS: Pertinent items in HPI  OBJECTIVE Blood pressure 108/69, pulse 75, temperature 97.1 F (36.2 C), temperature source Oral, resp. rate 20, height 5\' 3"  (1.6 m), weight 170 lb 9.6 oz (77.384 kg), last menstrual period 09/08/2011. GENERAL: Well-developed, well-nourished female in no acute distress.  LUNGS: Clear to auscultation bilaterally.  HEART: Regular rate and rhythm. ABDOMEN: Soft, nontender EXTREMITIES: Nontender, no edema SPECULUM EXAM: NEFG, physiologic discharge, no blood noted, cervix clean, parous BIMANUAL: cervix L/C, no CMT; uterus slighlty enlarged, NT no mass,; no adnexal tenderness or masses   LAB RESULTS UA not repeated (negative 08/09/11) GC,CT neg 1 month ago [facility]  sent     ASSESSMENT/PLAN Abdominal and pelvic pain may be due to adhesions. Since uterus is slightly enlarged Will obtain pelvic ultrasound.  Vaginal irritation and dyspareunia -Wet prep pending, gust lubrication and positions.  Contraception-> Discussed options at length and she will consider guarding Depo-Provera when she returns for followup at in one month

## 2011-09-17 LAB — WET PREP, GENITAL
Trich, Wet Prep: NONE SEEN
Yeast Wet Prep HPF POC: NONE SEEN

## 2011-09-21 ENCOUNTER — Ambulatory Visit (HOSPITAL_COMMUNITY)
Admission: RE | Admit: 2011-09-21 | Discharge: 2011-09-21 | Disposition: A | Discharge status: Home / Self Care | Payer: Medicaid Other | Source: Ambulatory Visit | Attending: Obstetrics and Gynecology | Admitting: Obstetrics and Gynecology

## 2011-09-21 DIAGNOSIS — N949 Unspecified condition associated with female genital organs and menstrual cycle: Secondary | ICD-10-CM | POA: Insufficient documentation

## 2011-09-21 DIAGNOSIS — N83209 Unspecified ovarian cyst, unspecified side: Secondary | ICD-10-CM | POA: Insufficient documentation

## 2011-09-21 DIAGNOSIS — R102 Pelvic and perineal pain: Secondary | ICD-10-CM

## 2011-10-19 ENCOUNTER — Ambulatory Visit (INDEPENDENT_AMBULATORY_CARE_PROVIDER_SITE_OTHER): Payer: Medicaid Other | Admitting: Obstetrics and Gynecology

## 2011-10-19 ENCOUNTER — Encounter: Payer: Self-pay | Admitting: Obstetrics and Gynecology

## 2011-10-19 VITALS — BP 114/77 | HR 81 | Temp 98.9°F | Ht 63.0 in | Wt 172.8 lb

## 2011-10-19 DIAGNOSIS — G4700 Insomnia, unspecified: Secondary | ICD-10-CM

## 2011-10-19 MED ORDER — ZOLPIDEM TARTRATE ER 12.5 MG PO TBCR: 12.5000 mg | EXTENDED_RELEASE_TABLET | Freq: Every evening | ORAL | Status: DC | PRN

## 2011-10-19 NOTE — Progress Notes (Signed)
[name] y.[id number] @Unknown  by LMP Chief Complaint  Patient presents with  . Results         SUBJECTIVE  HPI: She presents in followup to visit here one month ago. She was seen for having  irregular menstrual intervals following breast-feeding for a year which she discontinued in October of 2012. Her last 3 menstrual periods however were all regular: 08/09/2011, 09/06/2011 and 10/07/2011. She bleeds fairly heavily for the first 2 days then has light flow for the following 3 days. Her main concern today is with inability to initially fall asleep at night. This has been of short duration for a few weeks. She states she lays worrying for about 2 hours before being able to fall asleep. Discussed her lifestyle and situational stressors and she can report no change from normal in either.  Past Medical History  Diagnosis Date  . 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  . Typhoid fever     hx of s/pabdominal  surgery for complication.  . Irregular menses     followed by Dr Okey Dupre and [facility] infertility clinic  . Depression   . 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 UTI (lower urinary tract infection)    Past Surgical History  Procedure Date  . Abdominal surgery     R/T typhoid fever 2002 and revision in 2004   History   Social History  . Marital Status: Married    Spouse Name: N/A    Number of Children: N/A  . Years of Education: N/A   Occupational History  . Not on file.   Social History Main Topics  . Smoking status: Never Smoker   . Smokeless tobacco: Not on file  . Alcohol Use: No  . Drug Use: No  . Sexually Active: Yes    Birth Control/ Protection: Condom, Rhythm   Other Topics Concern  . Not on file   Social History Narrative   Financial assistance approved for 100% discount at [facility] and has [facility] card, Rudell Cobb Aug 02, 2009.   Current Outpatient Prescriptions on File Prior to  Visit  Medication Sig Dispense Refill  . acetaminophen (TYLENOL) 325 MG tablet Take 650 mg by mouth every 6 (six) hours as needed. For pain.      . norgestimate-ethinyl estradiol (SPRINTEC 28) 0.25-35 MG-MCG tablet Take 1 tablet by mouth daily.  1 Package  5  . zolpidem (AMBIEN CR) 12.5 MG CR tablet Take 1 tablet (12.5 mg total) by mouth at bedtime as needed for sleep.  15 tablet  0   Allergies  Allergen Reactions  . Ibuprofen Other (See Comments)    Chest pain    ROS: Pertinent items in HPI  OBJECTIVE Blood pressure 114/77, pulse 81, temperature 98.9 F (37.2 C), height 5\' 3"  (1.6 m), weight 172 lb 12.8 oz (78.382 kg), last menstrual period 10/08/2011.  GENERAL: Well-developed, well-nourished female in no acute distress.  HEENT: Normocephalic, good dentition HEART: normal rate RESP: normal effort ABDOMEN: Soft, nontender EXTREMITIES: Nontender, no edema NEURO: Alert and oriented  LAB RESULTS  Pap from last visit - normal     ASSESSMENT Irregular menstrual intervals following the cessation of breast-feeding - resolved without tx Insomnia  PLAN Reviewed contraceptive options at length and encouraged LARC but patient elects to continue with condoms only. She wants to conceive again in a couple of years.  Keep menstrual calendar and return for Pap in  1 year.  Insomnia measures discussed and handout given. She is interested in herbs so recommended valerian root and melatonin. If general measures ineffective try Benadryl and  Ambien prn ( #15 prescribed)       [name] 10/19/2011 1:41 PM

## 2011-10-19 NOTE — Patient Instructions (Signed)

## 2011-10-21 ENCOUNTER — Telehealth: Payer: Self-pay | Admitting: *Deleted

## 2011-10-21 NOTE — Telephone Encounter (Signed)
Pt left message stating that her pharmacy needs additional information for her Rx. Please call back.

## 2011-10-21 NOTE — Telephone Encounter (Signed)
I spoke w/pt and she stated that the pharmacy needs pre-authorization from medicaid in order to fill the Rx for Ambien.  I asked pt if she had tried the other remedies which were discussed @ the time of her visit (valerian root, melatonin and Benadryl). She said that she had not. I advised that she try them first because Ambien is a strong sleep med and if she can try something else first, she may not need the Ambien.  Pt wrote down the spelling of the alternate sleep remedies and will try them first.  Pt voiced understanding of all information provided.

## 2012-12-16 ENCOUNTER — Ambulatory Visit (INDEPENDENT_AMBULATORY_CARE_PROVIDER_SITE_OTHER): Payer: Self-pay | Admitting: Internal Medicine

## 2012-12-16 ENCOUNTER — Encounter: Payer: Self-pay | Admitting: Internal Medicine

## 2012-12-16 VITALS — BP 101/68 | HR 70 | Temp 98.2°F | Wt 179.2 lb

## 2012-12-16 DIAGNOSIS — Z23 Encounter for immunization: Secondary | ICD-10-CM

## 2012-12-16 DIAGNOSIS — M545 Low back pain: Secondary | ICD-10-CM

## 2012-12-16 DIAGNOSIS — Z0000 Encounter for general adult medical examination without abnormal findings: Secondary | ICD-10-CM

## 2012-12-16 DIAGNOSIS — G8911 Acute pain due to trauma: Secondary | ICD-10-CM

## 2012-12-16 MED ORDER — CYCLOBENZAPRINE HCL 5 MG PO TABS: 5.0000 mg | ORAL_TABLET | Freq: Three times a day (TID) | ORAL | Status: DC | PRN

## 2012-12-16 NOTE — Progress Notes (Signed)
Patient ID: [name], female   DOB: [birth year], 32 y.o.   MRN: [id number]  Subjective:   Patient ID: [name] female   DOB: Nov 14, 1980 32 y.o.   MRN: [id number]  HPI: [name] is a 32 y.o. female with history of remote typhoid fever s/p intraabdominal surgery, irregular menses presenting to the clinic to reestablish care and for an acute visit for low back pain. Patient reports on Tuesday she was stepping up on a stool to hang a picture in her room and misstepped and fell to the ground. When she was trying to stand up, the picture on the wall fell and hit her on her right lower back. Since that time she's had intermittent sharp squeezing pain of her right lower back. This occurs throughout the day, and is worse at night. She's had no numbness or weakness. She had no skin breakage or bleeding. Pain is worse when twisting side to side or sitting up from a lying down position. She has taken Aleve 2 tablets twice a day and Motrin at night. She says these helped the pain, but it doesn't completely remit. In terms of her health maintenance, she is due for a tetanus shot. She gets her Pap smears done at [facility]. LMP 12/11/12.   Past Medical History  Diagnosis Date  . 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  . Typhoid fever     hx of s/pabdominal  surgery for complication.  . Irregular menses     followed by Dr Okey Dupre and [facility] infertility clinic  . Depression   . 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 UTI (lower urinary tract infection)    Current Outpatient Prescriptions  Medication Sig Dispense Refill  . acetaminophen (TYLENOL) 325 MG tablet Take 650 mg by mouth every 6 (six) hours as needed. For pain.      . cyclobenzaprine (FLEXERIL) 5 MG tablet Take 1 tablet (5 mg total) by mouth 3 (three) times daily as needed for muscle spasms.  30 tablet  0   No current facility-administered medications  for this visit.   Family History  Problem Relation Age of Onset  . Cancer Maternal Aunt     breast cancer at age 57yrs   History   Social History  . Marital Status: Married    Spouse Name: N/A    Number of Children: N/A  . Years of Education: N/A   Social History Main Topics  . Smoking status: Never Smoker   . Smokeless tobacco: None  . Alcohol Use: Yes     Comment: Occasionally  . Drug Use: No  . Sexually Active: Yes    Birth Control/ Protection: Condom     Comment: Occasional condoms   Other Topics Concern  . None   Social History Narrative   Financial assistance approved for 100% discount at [facility] and has [facility] card, Rudell Cobb Aug 02, 2009.   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   Review of Systems: 10 pt ROS performed, pertinent positives and negatives noted in HPI Objective:  Physical Exam: Filed Vitals:   12/16/12 1023  BP: 101/68  Pulse: 70  Temp: 98.2 F (36.8 C)  TempSrc: Oral  Weight: 179 lb 3.2 oz (81.285 kg)  SpO2: 100%   Constitutional: Vital signs reviewed.  Patient is an overweight woman in no acute distress and cooperative with exam. Alert and oriented x3.  Head: Normocephalic and atraumatic  Ear: TM pearly b/l ears Mouth: no erythema or exudates, MMM Eyes: PERRL, EOMI, conjunctivae normal, No scleral icterus.  Neck: Supple Cardiovascular: RRR, S1 normal, S2 normal, no MRG, pulses symmetric and intact bilaterally Pulmonary/Chest: CTAB, no wheezes, rales, or rhonchi Abdominal: Soft. Non-tender, non-distended, bowel sounds are normal, no masses, organomegaly, or guarding present.  GU: no CVA tenderness Musculoskeletal: No tenderness to palpation of the lumbar spine. No tenderness to palpation of the right iliac crest. Does have tenderness to palpation of lumbar paraspinal muscles. Strength of legs is 5 out of 5. No joint deformities, erythema, or stiffness, ROM full and no nontender Neurological: A&O x3, Strength is normal and symmetric  bilaterally, cranial nerve II-XII are grossly intact, no focal motor deficit, sensory intact to light touch bilaterally.  negative straight leg bilaterally. Skin: No abrasions  Psychiatric: Normal mood and affect. Assessment & Plan:   Please see problem-based charting for assessment and plan.

## 2012-12-16 NOTE — Assessment & Plan Note (Signed)
Up-to-date on Pap smears. Due for tetanus shot today, which was given. Discussed the need for weight loss as an important preventive care measure. She says that she does need help with her diet next is size, and would like to meet with our clinical nutritionist. She would like to wait and so after she has the orange card. -Referred to [facility] for appointment in one month.

## 2012-12-16 NOTE — Patient Instructions (Addendum)
1. You can take aleve 500mg  twice a day OR motrin 800mg  three times a day. Do not take both together. You can add tylenol no more than 3,000mg  total per day. Try using heat therapy (see below). You can try flexeril which will help with muscle spasm. Take one pill every 8 hours as needed. Do not take before driving as it will make you sedated. 2. Come back if you are not better after a couple of weeks.     Heat Therapy Heat therapy can help ease achy, tense, stiff, and tight muscles and joints. Heat should not be used on new injuries. Wait at least 48 hours after the injury before using heat therapy. Heat also should not be used for discomfort or pain that occurs right after doing an activity. If you still have pain or stiffness 3 hours after finishing the activity, then heat therapy may be used. PRECAUTIONS  High heat or prolonged exposure to heat can cause burns. Be careful when using heat therapy to avoid burning your skin. If you have any of the following conditions, do not use heat until you have discussed heat therapy with your caregiver:  Poor circulation.  Healing wounds or scarred skin in the area being treated.  Diabetes, heart disease, or high blood pressure.  Numbness of the area being treated.  Unusual swelling of the area being treated.  Active infections.  Blood clots.  Cancer.  Inability to communicate your response to pain. This can include young children and people with dementia. HOME CARE INSTRUCTIONS Moist heat pack  Soak a clean towel in warm water, and squeeze out the extra water. The water temperature should be comfortable to the skin.  Put the warm, wet towel in a plastic bag.  Place a thin, dry towel between your skin and the bag.  Put the heat pack on the area for 5 minutes, and check your skin. Your skin may be pink, but it should not be red.  Leave the heat pack on the area for a total of 15 to 30 minutes.  Repeat this every 2 to 4 hours while awake.  Do not use heat while you are sleeping. Warm water bath  Fill a tub with warm water. The water temperature should be comfortable to the skin.  Place the affected body part in the tub.  Soak the area for 20 to 40 minutes.  Repeat as needed. Hot water bottle  Fill the water bottle half full with hot water.  Press out the extra air. Close the cap tightly.  Place a dry towel between your skin and the bottle.  Put the bottle on the area for 5 minutes, and check your skin. Your skin may be pink, but it should not be red.  Leave the bottle on the area for a total of 15 to 30 minutes.  Repeat this every 2 to 4 hours while awake. Electric heating pad  Place a dry towel between your skin and the heating pad.  Set the heating pad on low heat.  Put the heating pad on the area for 10 minutes, and check your skin. Your skin may be pink, but it should not be red.  Leave the heating pad on the area for a total of 20 to 40 minutes.  Repeat this every 2 to 4 hours while awake.  Do not lie on the heating pad.  Do not fall asleep while using the heating pad.  Do not use the heating pad near water.  Contact with water can result in an electrical shock. SEEK MEDICAL CARE IF:  You have blisters, redness, swelling, or numbness.  You have any new problems.  Your problems are getting worse.  You have any questions or concerns. If you develop any problems, stop using heat therapy until you see your caregiver. MAKE SURE YOU:  Understand these instructions.  Will watch your condition.  Will get help right away if you are not doing well or get worse. Document Released: 08/31/2011 Document Reviewed: 08/31/2011 [facility] Patient Information 2014 [place], [place].

## 2012-12-16 NOTE — Assessment & Plan Note (Signed)
No bony tenderness on exam to suggest fracture. No neurologic deficits. No skin breakdown or hematoma. Suspect that this is an inflammatory reaction to trauma to her lower back. -Recommend continued anti-inflammatory with Aleve 500mg  BID OR Motrin 800 3 times a day, but not both together. Recommend adding Tylenol no more than 3000 mg a day. - Will prescribe 30 tablets of Flexeril 5 mg to help with sleep and muscle spasm at night - Discussed at length with patient he therapy and how to make heat packs at home with towels or water bottles. - Return to clinic if worsening symptoms or no improvement after a couple of weeks.

## 2012-12-17 NOTE — Progress Notes (Signed)
Case discussed with Dr. Ziemer soon after the resident saw the patient. We reviewed the resident's history and exam and pertinent patient test results. I agree with the assessment, diagnosis, and plan of care documented in the resident's note. 

## 2013-01-16 ENCOUNTER — Ambulatory Visit: Payer: Self-pay | Admitting: Dietician

## 2013-01-16 ENCOUNTER — Telehealth: Payer: Self-pay | Admitting: *Deleted

## 2013-01-16 NOTE — Telephone Encounter (Signed)
Patient is returning phone call.  She states she was unaware of the appointment today.  This patient at this time wishes not to be seen until her Medicaid Acceptance.  She keeps  getting bills that she can't pay for and will call us when she is ready to make and appointment with the clinic.

## 2013-01-17 NOTE — Progress Notes (Signed)
Patient did not show for this visit. Opened in error when interpreter came, it appeared patient was arrived in [place].

## 2013-01-17 NOTE — Addendum Note (Signed)
Addended by: Neomia Dear on: 01/17/2013 07:23 PM   Modules accepted: Orders

## 2013-01-22 IMAGING — US US TRANSVAGINAL NON-OB
1 series · 14 of 25 positions shown · non-contrast
Comparison: OB ultrasound 12/04/2009.

CLINICAL DATA: Pelvic pain

TRANSABDOMINAL AND TRANSVAGINAL ULTRASOUND OF PELVIS
TECHNIQUE: Both transabdominal and transvaginal ultrasound
examinations of the pelvis were performed. Transabdominal technique
was performed for global imaging of the pelvis including uterus,
ovaries, adnexal regions, and pelvic cul-de-sac.

[Series 1: us pelvis complete · 87 acquisitions, 14 frames shown]
[im 1/87]
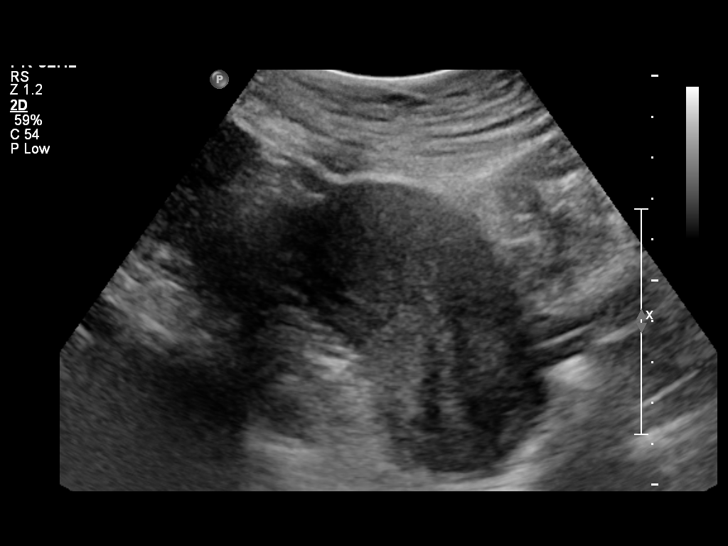
[im 8/87]
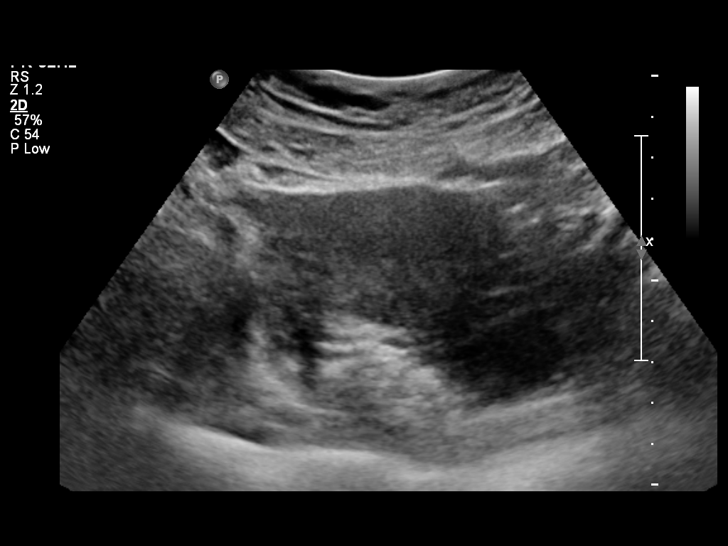
[im 15/87]
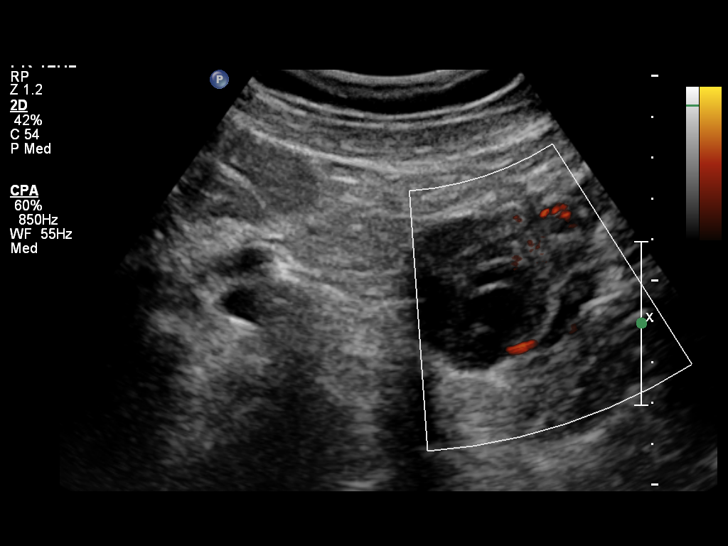
[im 22/87]
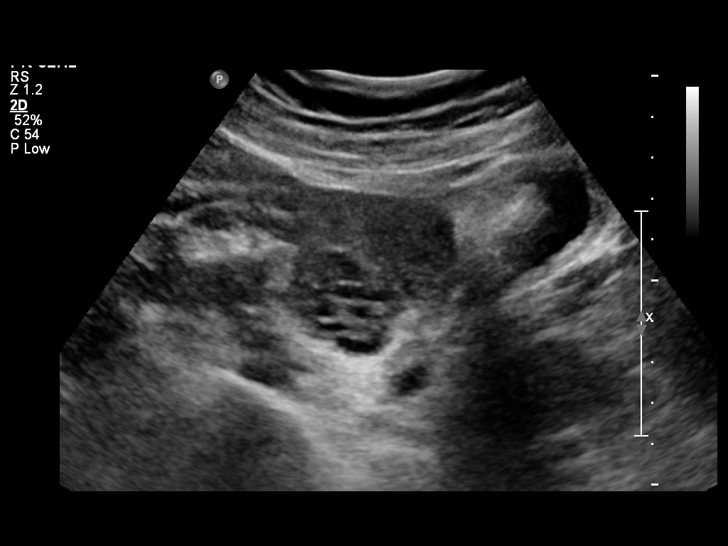
[im 29/87]
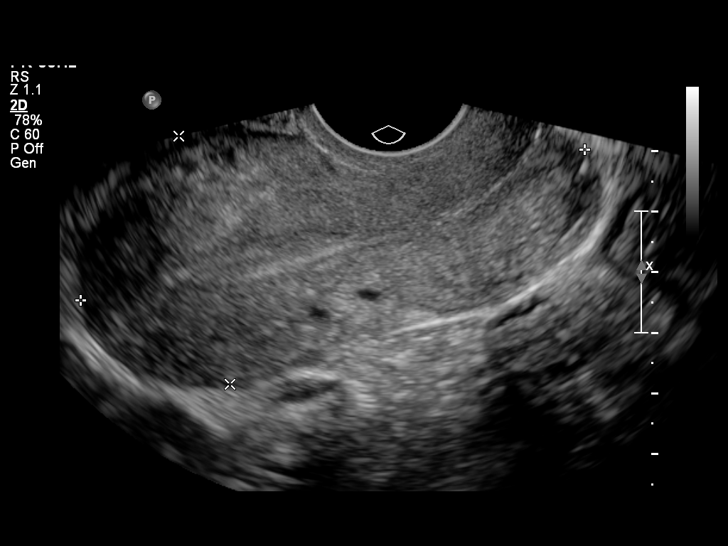
[im 33/87]
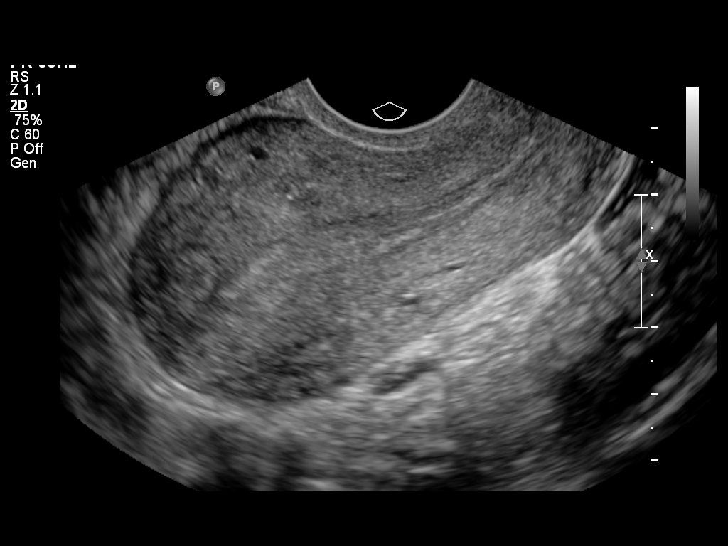
[im 40/87]
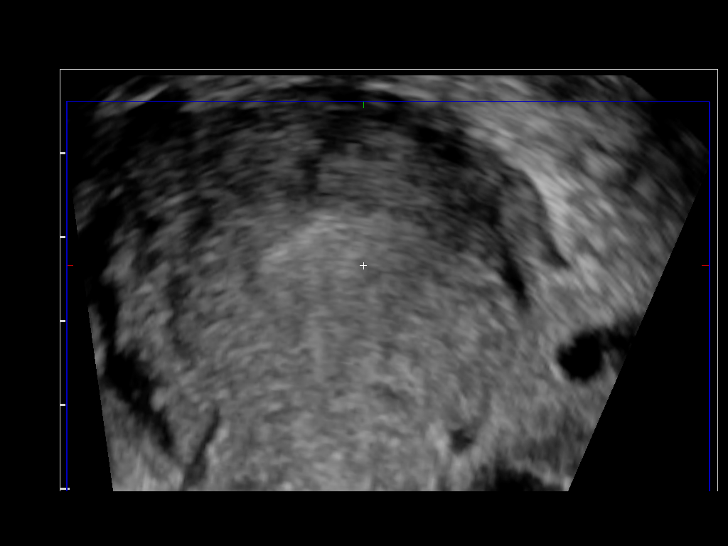
[im 47/87]
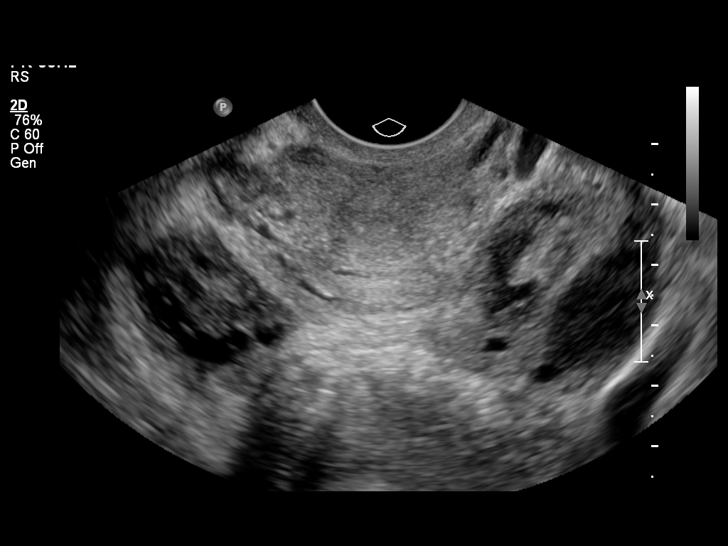
[im 54/87]
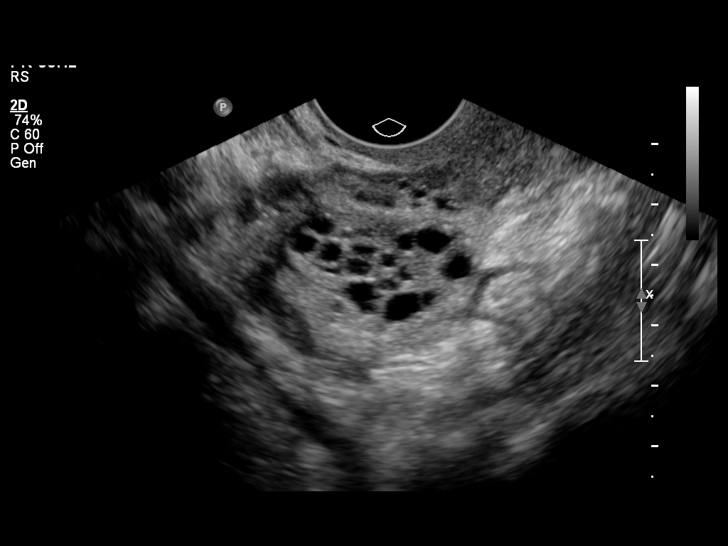
[im 58/87]
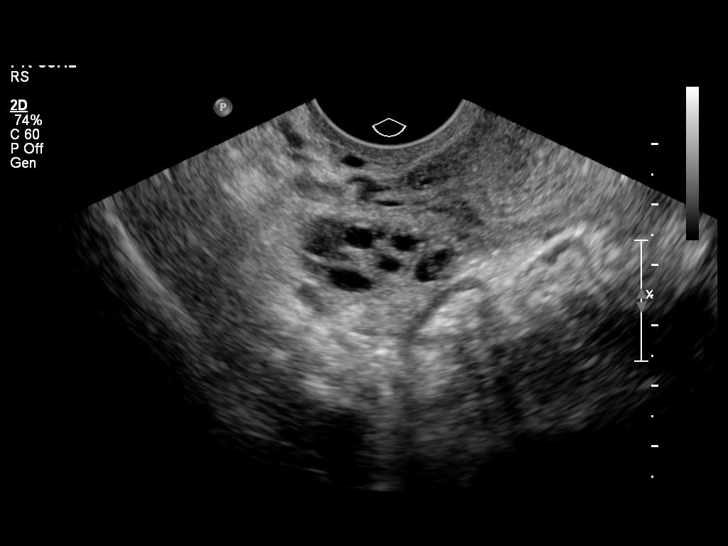
[im 65/87]
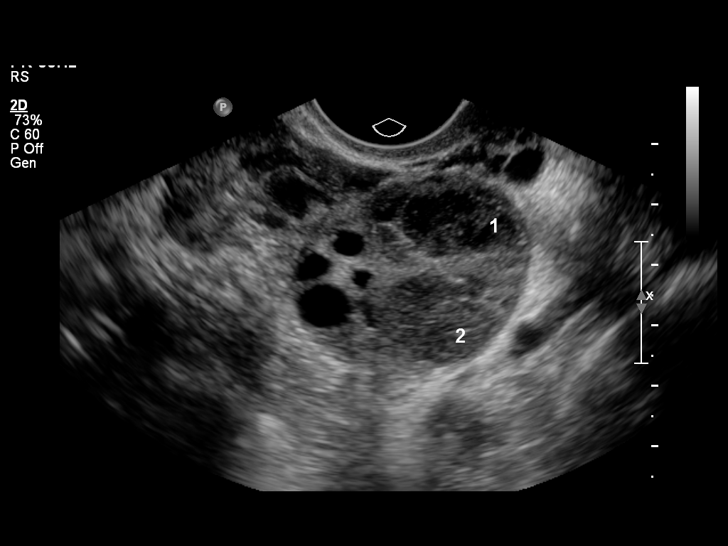
[im 72/87]
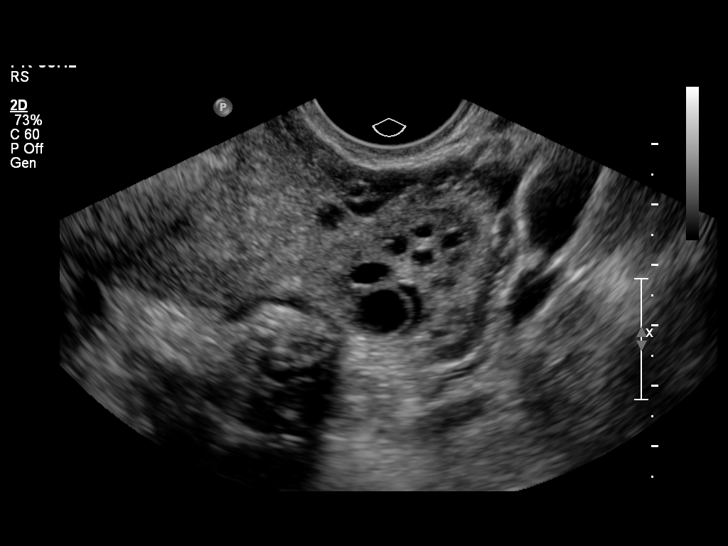
[im 79/87]
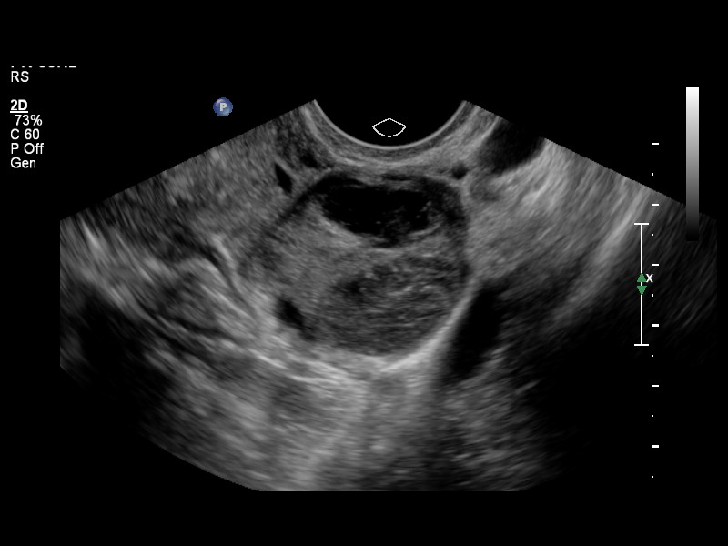
[im 87/87]
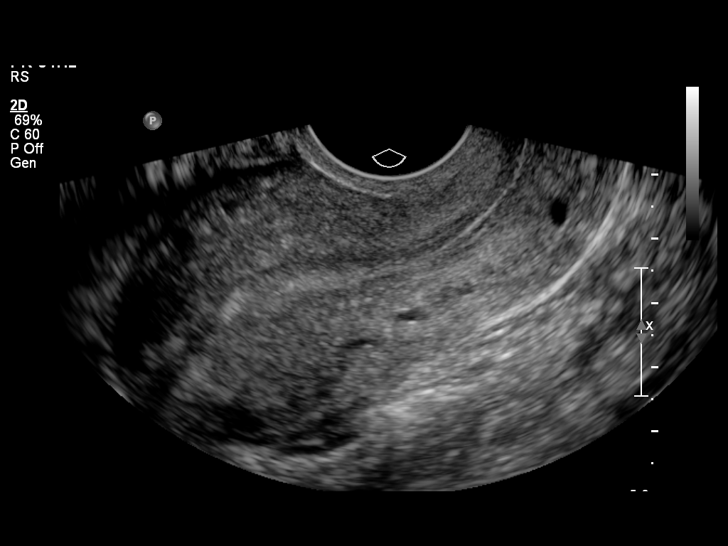

[14 of 25 positions shown; findings below may reference images not displayed]

No similar prior study is
available for comparison.

 It was necessary to proceed with endovaginal exam following the
transabdominal exam to visualize the ovaries.
FINDINGS: Uterus: 8.9 x 5.4 x 4.2 cm.  Anteverted, anteflexed.  No focal
abnormality.

Endometrium: 7 mm.  Uniformly echogenic and thin.

Right ovary:  3.4 x 2.5 x 2.9 cm.  Normal.

Left ovary: 4.4 x 3.7 x 3.3 cm.  Two involuting cystic lesions with
reticulated internal echoes and [place], cobweb appearance are typical
for involuting physiologic hemorrhagic cysts.

Other findings: No free fluid
IMPRESSION: No acute abnormality.  Resolving left ovarian physiologic
hemorrhagic cysts incidentally noted..

## 2014-04-23 ENCOUNTER — Encounter: Payer: Self-pay | Admitting: Internal Medicine

## 2014-06-13 ENCOUNTER — Ambulatory Visit: Payer: Self-pay | Admitting: Obstetrics and Gynecology

## 2014-10-10 ENCOUNTER — Ambulatory Visit (INDEPENDENT_AMBULATORY_CARE_PROVIDER_SITE_OTHER): Payer: 59 | Admitting: Obstetrics & Gynecology

## 2014-10-10 ENCOUNTER — Encounter: Payer: Self-pay | Admitting: Obstetrics & Gynecology

## 2014-10-10 ENCOUNTER — Other Ambulatory Visit: Payer: Self-pay | Admitting: Obstetrics & Gynecology

## 2014-10-10 VITALS — BP 114/80 | HR 66 | Ht 63.75 in | Wt 189.0 lb

## 2014-10-10 DIAGNOSIS — G8929 Other chronic pain: Secondary | ICD-10-CM

## 2014-10-10 DIAGNOSIS — N979 Female infertility, unspecified: Secondary | ICD-10-CM | POA: Diagnosis not present

## 2014-10-10 DIAGNOSIS — N926 Irregular menstruation, unspecified: Secondary | ICD-10-CM

## 2014-10-10 DIAGNOSIS — R102 Pelvic and perineal pain: Secondary | ICD-10-CM

## 2014-10-10 DIAGNOSIS — N949 Unspecified condition associated with female genital organs and menstrual cycle: Secondary | ICD-10-CM | POA: Diagnosis not present

## 2014-10-10 MED ORDER — MEDROXYPROGESTERONE ACETATE 10 MG PO TABS: 10.0000 mg | ORAL_TABLET | Freq: Every day | ORAL | Status: DC

## 2014-10-10 NOTE — Progress Notes (Signed)
Patient is trying to conceive for approximately 3 years.  Her periods were normal for two to three years after she had her daughter but then started to become abnormal.  She has not had a cycle not since June 05, 2014.  [facility] and is not pregnant.  She also has some lower pelvic pain discomfort as well.  She also complains of an increase in headaches.

## 2014-10-10 NOTE — Progress Notes (Signed)
CLINIC ENCOUNTER NOTE  History:  34 y.o. [id number] here today for evaluation of irregular menstrual periods, headaches and secondary infertility. Was seen at the [facility] for annual exam in 04/2014, had normal pap smear and was sent here for further evaluation. Of note, patient had been evaluated in the [facility] clinic several years ago for irregular menses and infertility. She was subsequently referred to [facility] [place] [facility] and was able to have two successful pregnancies.  She reports wanting a third child, has been trying for three years since the birth of her daughter.  Patient reports her periods were normal for two years after she had her daughter but then started to become abnormal. She has not had a cycle not since June 05, 2014. This is associated with pain in the lower pelvic region that is episodic in nature, moderate in intensity, alleviated by NSAIDS.  No current pelvic pain.  She also gets headaches that are bitemporal, moderate and also alleviated by NSAIDs.   She is worried that her headaches and amenorrhea are related.   Past Medical History  Diagnosis Date  . 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  . Typhoid fever     hx of s/pabdominal  surgery for complication.  . Irregular menses     followed by Dr Kalman Shan and [facility] infertility clinic  . Depression   . 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 UTI (lower urinary tract infection)     Past Surgical History  Procedure Laterality Date  . Abdominal surgery      R/T typhoid fever 2002 and revision in 2004    The following portions of the patient's history were reviewed and updated as appropriate: allergies, current medications, past family history, past medical history, past social history, past surgical history and problem list.   Health Maintenance:  Normal pap at [facility] in 04/2014.   Review of Systems:  Gastrointestinal: positive for diffuse abdominal  pain that occasionally occurs secondary to multiple surgeries in past Psychiatric: Occasional depressed mood due to concerns about secondary infertility Comprehensive review of systems was otherwise negative.   Objective:  Physical Exam BP 114/80 mmHg  Pulse 66  Ht 5' 3.75" (1.619 m)  Wt 189 lb (85.73 kg)  BMI 32.71 kg/m2  LMP 07/06/2013 (Exact Date) CONSTITUTIONAL: Well-developed, well-nourished female in no acute distress.  HENT:  Normocephalic, atraumatic, External right and left ear normal. Oropharynx is clear and moist EYES: Conjunctivae and EOM are normal. Pupils are equal, round, and reactive to light. No scleral icterus.  NECK: Normal range of motion, supple, no masses SKIN: Skin is warm and dry. No rash noted. Not diaphoretic. No erythema. No pallor. [place]: Alert and oriented to person, place, and time. Normal reflexes, muscle tone coordination. No cranial nerve deficit noted. PSYCHIATRIC: Normal mood and affect. Normal behavior. Normal judgment and thought content. CARDIOVASCULAR: Normal heart rate noted, regular rhythm RESPIRATORY: Effort and breath sounds normal, no problems with respiration noted ABDOMEN: Soft, no distention noted.  No tenderness, rebound or guarding.  Broad vertical incision and bilateral lower abdomen incision sites (previous colostomy sites?) with keloid and contractures.   PELVIC: Deferred MUSCULOSKELETAL: Normal range of motion. No edema and no tenderness.   Assessment & Plan:  Patient's irregular menses are most likely of an anovulatory etiology.  Differential diagnoses include PCOS, thyroid dysfunction,  elevated prolactin dysfunction, stress, or anything can disturb the hypothalamic-pituitary-ovarian axis.  Will test [facility] (for  ovarian reserve), PRL, HCG, TSH, HgA1C, free and total testosterone (markers for hyperandrogenic state).  Will also order pelvic ultrasound to examine her ovaries and uterus; also to rule out any structural abnormalities.   Patient was also given Provera challenge, will observe her response.  She will return in about a month to go over results and decide about further evaluation and management.  Patient will contact REI provider to set up appointment for evaluation/management of secondary infertility.   She will continue NSAIDs as needed for headaches. If headaches persist and no etiology is found, will refer to Headache specialist or Neurologist.    Routine preventative health maintenance measures emphasized.   Total face-to-face time with patient: 30 minutes. Over 50% of encounter was spent on counseling and coordination of care.   Verita Schneiders, MD, [place] Attending [place] for [employer], [place]

## 2014-10-10 NOTE — Patient Instructions (Addendum)
Thank you for enrolling in [place]. Please follow the instructions below to securely access your online medical record. MyChart allows you to send messages to your doctor, view your test results, manage appointments, and more.   How Do I Sign Up? 1. In your Internet browser, go to [employer] and enter [email or web address]. 2. Click on the Sign Up Now link in the Sign In box. You will see the New Member Sign Up page. 3. Enter your MyChart Access Code exactly as it appears below. You will not need to use this code after you've completed the sign-up process. If you do not sign up before the expiration date, you must request a new code.  MyChart Access Code: [email or web address] Expires: 12/09/2014  9:02 AM  4. Enter your Social Security Number ([id number]) and Date of Birth (mm/dd/yyyy) as indicated and click Submit. You will be taken to the next sign-up page. 5. Create a MyChart ID. This will be your MyChart login ID and cannot be changed, so think of one that is secure and easy to remember. 6. Create a MyChart password. You can change your password at any time. 7. Enter your Password Reset Question and Answer. This can be used at a later time if you forget your password.  8. Enter your e-mail address. You will receive e-mail notification when new information is available in [place]. 9. Click Sign Up. You can now view your medical record.   Additional Information Remember, MyChart is NOT to be used for urgent needs. For medical emergencies, dial 911.   Return to clinic for any scheduled appointments or for any gynecologic concerns as needed.

## 2014-10-11 ENCOUNTER — Other Ambulatory Visit: Payer: Self-pay | Admitting: Obstetrics & Gynecology

## 2014-10-11 DIAGNOSIS — N926 Irregular menstruation, unspecified: Secondary | ICD-10-CM

## 2014-10-11 DIAGNOSIS — R51 Headache: Secondary | ICD-10-CM

## 2014-10-11 DIAGNOSIS — R519 Headache, unspecified: Secondary | ICD-10-CM | POA: Insufficient documentation

## 2014-10-11 DIAGNOSIS — R7989 Other specified abnormal findings of blood chemistry: Secondary | ICD-10-CM

## 2014-10-11 DIAGNOSIS — E229 Hyperfunction of pituitary gland, unspecified: Principal | ICD-10-CM

## 2014-10-11 LAB — COMPREHENSIVE METABOLIC PANEL
ALK PHOS: 69 U/L (ref 39–117)
ALT: 23 U/L (ref 0–35)
AST: 23 U/L (ref 0–37)
Albumin: 4.4 g/dL (ref 3.5–5.2)
BILIRUBIN TOTAL: 0.4 mg/dL (ref 0.2–1.2)
BUN: 9 mg/dL (ref 6–23)
CO2: 26 mEq/L (ref 19–32)
Calcium: 9.6 mg/dL (ref 8.4–10.5)
Chloride: 103 mEq/L (ref 96–112)
Creat: 0.72 mg/dL (ref 0.50–1.10)
GLUCOSE: 81 mg/dL (ref 70–99)
Potassium: 4.2 mEq/L (ref 3.5–5.3)
SODIUM: 140 meq/L (ref 135–145)
TOTAL PROTEIN: 7.5 g/dL (ref 6.0–8.3)

## 2014-10-11 LAB — TESTOSTERONE, FREE, TOTAL, SHBG
Sex Hormone Binding: 39 nmol/L (ref 17–124)
TESTOSTERONE-% FREE: 1.6 % (ref 0.4–2.4)
TESTOSTERONE: 36 ng/dL (ref 10–70)
Testosterone, Free: 5.8 pg/mL (ref 0.6–6.8)

## 2014-10-11 LAB — TSH: TSH: 0.861 u[IU]/mL (ref 0.350–4.500)

## 2014-10-11 LAB — HEMOGLOBIN A1C
HEMOGLOBIN A1C: 5.7 % — AB (ref <5.7)
MEAN PLASMA GLUCOSE: 117 mg/dL — AB (ref <117)

## 2014-10-11 LAB — PROLACTIN: Prolactin: 167.6 ng/mL

## 2014-10-11 LAB — FOLLICLE STIMULATING HORMONE: FSH: 8.5 m[IU]/mL

## 2014-10-11 LAB — HCG, QUANTITATIVE, PREGNANCY: hCG, Beta Chain, Quant, S: <2 m[IU]/mL

## 2014-10-24 ENCOUNTER — Ambulatory Visit (HOSPITAL_COMMUNITY)
Admission: RE | Admit: 2014-10-24 | Discharge: 2014-10-24 | Disposition: A | Discharge status: Home / Self Care | Payer: 59 | Source: Ambulatory Visit | Attending: Obstetrics & Gynecology | Admitting: Obstetrics & Gynecology

## 2014-10-24 DIAGNOSIS — R102 Pelvic and perineal pain: Secondary | ICD-10-CM

## 2014-10-24 DIAGNOSIS — N926 Irregular menstruation, unspecified: Secondary | ICD-10-CM | POA: Diagnosis present

## 2014-10-24 DIAGNOSIS — G8929 Other chronic pain: Secondary | ICD-10-CM

## 2014-11-06 ENCOUNTER — Encounter: Payer: Self-pay | Admitting: Obstetrics & Gynecology

## 2014-11-06 ENCOUNTER — Ambulatory Visit (INDEPENDENT_AMBULATORY_CARE_PROVIDER_SITE_OTHER): Payer: 59 | Admitting: Obstetrics & Gynecology

## 2014-11-06 VITALS — BP 117/82 | HR 70 | Ht 61.0 in | Wt 187.6 lb

## 2014-11-06 DIAGNOSIS — N979 Female infertility, unspecified: Secondary | ICD-10-CM | POA: Diagnosis not present

## 2014-11-06 DIAGNOSIS — N926 Irregular menstruation, unspecified: Secondary | ICD-10-CM | POA: Diagnosis not present

## 2014-11-06 DIAGNOSIS — E229 Hyperfunction of pituitary gland, unspecified: Secondary | ICD-10-CM | POA: Diagnosis not present

## 2014-11-06 DIAGNOSIS — R7989 Other specified abnormal findings of blood chemistry: Secondary | ICD-10-CM

## 2014-11-06 NOTE — Patient Instructions (Signed)
Magnetic Resonance Imaging Magnetic resonance imaging (MRI) is a painless test that takes pictures of the inside of your body. This test uses a strong magnet. This test does not use X-rays or radiation. BEFORE THE PROCEDURE  You will be asked to take off all metal. This includes:  Your watch, jewelry, and other metal items.  Some makeup may have very small bits of metal and may need to be taken off.  Braces and fillings normally are not a problem. PROCEDURE  You may be given earplugs or headphones to listen to music. The machine can be noisy.  You might get a shot (injection) with a dye (contrast material) to help the MRI take better pictures.  MRI is done in a tunnel-shaped scanner. You will lie on a table that slides into the tunnel-shaped scanner. Once inside, you will still be able to talk to the person doing the test.  You will be asked to hold very still. You will be told when you can shift position. You may have to wait a few minutes to make sure the images are readable. AFTER THE PROCEDURE  You may go back to your normal activities right away.  If you got a shot of dye, it will pass naturally through your body within a day.  Your doctor will talk to you about the results. Document Released: 07/11/2010 Document Revised: 10/23/2013 Document Reviewed: 08/03/2013 [facility] Patient Information 2015 [place], [place]. This information is not intended to replace advice given to you by your health care provider. Make sure you discuss any questions you have with your health care provider.

## 2014-11-06 NOTE — Progress Notes (Signed)
CLINIC ENCOUNTER NOTE  History:  34 y.o. [name] here today for follow up for workup of irregular menstrual periods, headaches and secondary infertility. Patient also reports having milky discharge form breasts periodically for past few months.    Past Medical History  Diagnosis Date  . 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  . Typhoid fever     hx of s/pabdominal  surgery for complication.  . Irregular menses     followed by Dr Kalman Shan and [facility] infertility clinic  . Depression   . 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 UTI (lower urinary tract infection)     Past Surgical History  Procedure Laterality Date  . Abdominal surgery      R/T typhoid fever 2002 and revision in 2004    The following portions of the patient's history were reviewed and updated as appropriate: allergies, current medications, past family history, past medical history, past social history, past surgical history and problem list.   Health Maintenance:  Normal pap at [facility] in 04/2014.   Review of Systems:  Endocrine: positive for milky discharge from breast Comprehensive review of systems was otherwise negative.  Objective:  Physical Exam BP 117/82 mmHg  Pulse 70  Ht 5\' 1"  (1.549 m)  Wt 187 lb 9.6 oz (85.095 kg)  BMI 35.47 kg/m2  LMP 07/06/2013 (Exact Date) CONSTITUTIONAL: Well-developed, well-nourished female in no acute distress.  HENT:  Normocephalic, atraumatic, External right and left ear normal. Oropharynx is clear and moist EYES: Conjunctivae and EOM are normal. Pupils are equal, round, and reactive to light. No scleral icterus.  NECK: Normal range of motion, supple, no masses SKIN: Skin is warm and dry. No rash noted. Not diaphoretic. No erythema. No pallor. [place]: Alert and oriented to person, place, and time. Normal reflexes, muscle tone coordination. No cranial nerve deficit noted. PSYCHIATRIC: Normal mood and  affect. Normal behavior. Normal judgment and thought content. CARDIOVASCULAR: Normal heart rate noted RESPIRATORY: Effort and breath sounds normal, no problems with respiration noted ABDOMEN: Soft, no distention noted.   PELVIC:Deferred MUSCULOSKELETAL: Normal range of motion. No edema and no tenderness.  Labs and Imaging US Transvaginal Non-ob  10/24/2014   CLINICAL DATA:  Irregular cycles for 2 years  EXAM: TRANSABDOMINAL AND TRANSVAGINAL ULTRASOUND OF PELVIS  TECHNIQUE: Both transabdominal and transvaginal ultrasound examinations of the pelvis were performed. Transabdominal technique was performed for global imaging of the pelvis including uterus, ovaries, adnexal regions, and pelvic cul-de-sac. It was necessary to proceed with endovaginal exam following the transabdominal exam to visualize the endometrium.  COMPARISON:  09/21/2011  FINDINGS: Uterus  Measurements: 9.0 x 3.4 x 5.4 cm. No fibroids or other mass visualized.  Endometrium  Thickness: 5 mm in thickness.  No focal abnormality visualized.  Right ovary  Measurements: 2.4 x 2.3 x 2.2 cm. Normal appearance/no adnexal mass.  Left ovary  Measurements: 2.9 x 1.9 x 1.9 cm. Normal appearance/no adnexal mass.  Other findings  No free fluid.  IMPRESSION: Unremarkable pelvic ultrasound.   Electronically Signed   By: Rolm Baptise M.D.   On: 10/24/2014 10:22   US Pelvis Complete  10/24/2014   CLINICAL DATA:  Irregular cycles for 2 years  EXAM: TRANSABDOMINAL AND TRANSVAGINAL ULTRASOUND OF PELVIS  TECHNIQUE: Both transabdominal and transvaginal ultrasound examinations of the pelvis were performed. Transabdominal technique was performed for global imaging of the pelvis including uterus, ovaries, adnexal regions, and pelvic cul-de-sac. It  was necessary to proceed with endovaginal exam following the transabdominal exam to visualize the endometrium.  COMPARISON:  09/21/2011  FINDINGS: Uterus  Measurements: 9.0 x 3.4 x 5.4 cm. No fibroids or other mass  visualized.  Endometrium  Thickness: 5 mm in thickness.  No focal abnormality visualized.  Right ovary  Measurements: 2.4 x 2.3 x 2.2 cm. Normal appearance/no adnexal mass.  Left ovary  Measurements: 2.9 x 1.9 x 1.9 cm. Normal appearance/no adnexal mass.  Other findings  No free fluid.  IMPRESSION: Unremarkable pelvic ultrasound.   Electronically Signed   By: Rolm Baptise M.D.   On: 10/24/2014 10:22    Results for orders placed or performed in visit on 10/10/14 (from the past 1344 hour(s))  Testosterone, Free, Total, SHBG   Collection Time: 10/10/14  9:42 AM  Result Value Ref Range   Testosterone 36 10 - 70 ng/dL   Sex Hormone Binding 39 17 - 124 nmol/L   Testosterone, Free 5.8 0.6 - 6.8 pg/mL   Testosterone-% Free 1.6 0.4 - 2.4 %  TSH   Collection Time: 10/10/14  9:42 AM  Result Value Ref Range   TSH 0.861 0.350 - 4.500 uIU/mL  Prolactin   Collection Time: 10/10/14  9:42 AM  Result Value Ref Range   Prolactin 167.6 ng/mL  Hemoglobin A1c   Collection Time: 10/10/14  9:42 AM  Result Value Ref Range   Hgb A1c MFr Bld 5.7 (H) <5.7 %   Mean Plasma Glucose 117 (H) <117 mg/dL  hCG, quantitative, pregnancy   Collection Time: 10/10/14  9:42 AM  Result Value Ref Range   hCG, Beta Chain, Quant, S <9.4 mIU/mL  Follicle stimulating hormone   Collection Time: 10/10/14  9:42 AM  Result Value Ref Range   FSH 8.5 mIU/mL  Results for orders placed or performed in visit on 10/10/14 (from the past 1344 hour(s))  Comprehensive metabolic panel   Collection Time: 10/10/14  9:42 AM  Result Value Ref Range   Sodium 140 135 - 145 mEq/L   Potassium 4.2 3.5 - 5.3 mEq/L   Chloride 103 96 - 112 mEq/L   CO2 26 19 - 32 mEq/L   Glucose, Bld 81 70 - 99 mg/dL   BUN 9 6 - 23 mg/dL   Creat 0.72 0.50 - 1.10 mg/dL   Total Bilirubin 0.4 0.2 - 1.2 mg/dL   Alkaline Phosphatase 69 39 - 117 U/L   AST 23 0 - 37 U/L   ALT 23 0 - 35 U/L   Total Protein 7.5 6.0 - 8.3 g/dL   Albumin 4.4 3.5 - 5.2 g/dL   Calcium 9.6  8.4 - 10.5 mg/dL    Assessment & Plan:  Patient had elevated prolactin concerning for adenoma. Elevated prolactin can explain all of her symptoms, this was explained to patient.  Will obtain MRI of head and follow up results. If adenoma is noted, will need referral to Endocrinologist or Neurosurgery if indicated.    Routine preventative health maintenance measures emphasized.  Total face-to-face time with patient: 15 minutes. Over 50% of encounter was spent on counseling and coordination of care.   Verita Schneiders, MD, [place] Attending [place] for [employer], [place]

## 2014-11-20 ENCOUNTER — Ambulatory Visit (HOSPITAL_COMMUNITY)
Admission: RE | Admit: 2014-11-20 | Discharge: 2014-11-20 | Disposition: A | Discharge status: Home / Self Care | Payer: 59 | Source: Ambulatory Visit | Attending: Obstetrics & Gynecology | Admitting: Obstetrics & Gynecology

## 2014-11-20 DIAGNOSIS — R51 Headache: Secondary | ICD-10-CM | POA: Diagnosis present

## 2014-11-20 DIAGNOSIS — E229 Hyperfunction of pituitary gland, unspecified: Secondary | ICD-10-CM

## 2014-11-20 DIAGNOSIS — N926 Irregular menstruation, unspecified: Secondary | ICD-10-CM | POA: Diagnosis not present

## 2014-11-20 DIAGNOSIS — R7989 Other specified abnormal findings of blood chemistry: Secondary | ICD-10-CM

## 2014-11-20 DIAGNOSIS — R519 Headache, unspecified: Secondary | ICD-10-CM

## 2014-11-20 MED ORDER — GADOBENATE DIMEGLUMINE 529 MG/ML IV SOLN
15.0000 mL | Freq: Once | INTRAVENOUS | Status: AC | PRN
  Administered 2014-11-20: 12 mL via INTRAVENOUS

## 2014-11-28 ENCOUNTER — Encounter: Payer: Self-pay | Admitting: Obstetrics & Gynecology

## 2014-11-28 ENCOUNTER — Ambulatory Visit (INDEPENDENT_AMBULATORY_CARE_PROVIDER_SITE_OTHER): Payer: 59 | Admitting: Obstetrics & Gynecology

## 2014-11-28 ENCOUNTER — Encounter (INDEPENDENT_AMBULATORY_CARE_PROVIDER_SITE_OTHER): Payer: Self-pay

## 2014-11-28 VITALS — BP 125/77 | HR 75 | Wt 185.0 lb

## 2014-11-28 DIAGNOSIS — N926 Irregular menstruation, unspecified: Secondary | ICD-10-CM | POA: Diagnosis not present

## 2014-11-28 DIAGNOSIS — E229 Hyperfunction of pituitary gland, unspecified: Secondary | ICD-10-CM

## 2014-11-28 DIAGNOSIS — R7989 Other specified abnormal findings of blood chemistry: Secondary | ICD-10-CM

## 2014-11-28 NOTE — Patient Instructions (Signed)
Tumeurs hypophysaires Les tumeurs hypophysaires sont des excroissances anormales trouves dans la glande pituitaire. L'hypophyse est un petit organe - de la taille d'un sou - situ dans le centre [employer]. Il rend les hormones qui influent sur la croissance et les fonctions des autres glandes dans l'organisme. La plupart des tumeurs de l'hypophyse sont bnignes. Cela signifie qu'ils sont noncancerous. Ils se dveloppent lentement et ne se propagent pas  d'autres parties [employer]. Une tumeur de l'hypophyse peut rendre la glande pituitaire produit trop d'hormones. Tumeurs qui hormones sont appeles tumeurs fonctionnelles (ceux qui ne font pas d'hormones sont appeles tumeurs non fonctionnelles). Les problmes qui peuvent tre causs par des tumeurs hypophysaires comprennent: la maladie de [place]. Cette maladie provoque la graisse de construire dans le visage, [employer] et la poitrine tandis que les bras et les jambes deviennent minces. Acromgalie. Ceci est une condition dans [employer] mains, les pieds et le visage sont plus grandes que la normale. la production de lait maternel, mme si il n'y a pas de [place]. CAUSES La cause de la plupart des tumeurs de l'hypophyse est inconnue. Dans certains cas, ces types de tumeurs fonctionnent dans [employer]. FACTEURS DE RISQUE Certains cas de tumeurs de l'hypophyse [employer]  des facteurs gntiques qu'une personne hrite qui augmentent la probabilit de dvelopper certaines tumeurs, y compris les tumeurs de l'hypophyse. SIGNES ET SYMPTMES Des maux de tte. Les problmes de vision. Faiblesse ou basse nergie. Effacer fluide de drainage du nez. Les [employer] sens de l'odorat. Se sentant mal  l'estomac (nauses) et vomissements. Les problmes causs par la production de trop d'hormones, telles que: Infertilit. Perte de priodes menstruelles [employer]. une croissance anormale. Une pression artrielle leve (hypertension). La  chaleur ou l'intolrance au froid. Autres peau et [employer] change. dcharge [place]. Diminution de la fonction sexuelle. DIAGNOSTIC Si vous dveloppez des symptmes, il vous sera envoy pour un scanner ou une IRM pour [employer] tumeurs de l'hypophyse. Si vous savez que ces types de [employer] votre famille, vous devrez peut-tre subir des tests sanguins rgulirement pour [employer] niveaux d'hormones hypophysaires. TRAITEMENT Ces tumeurs sont mieux traits quand ils sont trouvs et diagnostiqus tt. Les traitements comprennent: L'ablation chirurgicale de la tumeur. Ceci est [employer] plus commun. La radiothrapie. Lors de [employer], les doses leves de rayons X sont utiliss pour tuer les cellules tumorales. Thrapie mdicamenteuse. Cela implique l'utilisation de certains mdicaments pour bloquer l'hypophyse de produire un trop grand nombre d'hormones. INSTRUCTIONS DE SOINS  DOMICILE Buvez beaucoup de liquides. Mesurez votre dbit urinaire si ordonn de Teacher, adult education par votre fournisseur de soins de sant. Ne prenez pas votre nez ou de Education officer, museum. Ne pas faire des [employer] exigent  rude preuve. Prenez tous les mdicaments selon les instructions de votre fournisseur de soins de sant. Gardez les rendez-vous de suivi comme indiqu par votre fournisseur de soins de sant. SEEK SOINS MDICAUX SI: Vous avez soudain, soif inhabituelle. Vous urinez frquemment. Vous avez un mal de tte qui ne va pas disparatre. Vous avez de [employer] de vision. Vous remarquez une fuite de liquide clair de votre nez ou les [place], une sensation de ruissellement de fluide  l'arrire de votre gorge, ou un got sal dans la bouche. Vous prouvez des Psychologist, prison and probation services. SEEK IMMDIATE SOINS MDICAUX SI: Vos symptmes deviennent soudainement grave. Vous avez un saignement de nez qui ne cesse pas aprs quelques minutes. Vous avez une fivre de plus  de  101  F (38,3  C). Vous avez un mal de tte svre ou une raideur de la nuque. Vous tes confus ou pas aussi alerte que d'habitude. Vous avez des [employer] poitrine ou d'essoufflement. Document de Sortie: 05/29/2002 Document de rvision: 01/29/2013 Document de rvision: 20/11/2012 ExitCare Information pour les patients  2015 [place], [place]. Cette information ne vise pas  remplacer les conseils donns par votre fournisseur de soins de sant. Assurez-vous de discuter de toutes les questions que vous avez avec votre fournisseur de soins de sant.

## 2014-11-28 NOTE — Progress Notes (Signed)
CLINIC ENCOUNTER NOTE  History:  34 y.o. [id number] here today for follow up results of evaluation for irregular menses, elevated prolactin.  Reprots continuing headaches, galactorrhea and irregular menses, no other symptoms.  Past Medical History  Diagnosis Date  . 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  . Typhoid fever     hx of s/pabdominal  surgery for complication.  . Irregular menses     followed by Dr Kalman Shan and [facility] infertility clinic  . Depression   . 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 UTI (lower urinary tract infection)     Past Surgical History  Procedure Laterality Date  . Abdominal surgery      R/T typhoid fever 2002 and revision in 2004    The following portions of the patient's history were reviewed and updated as appropriate: allergies, current medications, past family history, past medical history, past social history, past surgical history and problem list.   Health Maintenance:  Normal pap at [facility] 04/2014.   Review of Systems:  Pertinent items are noted in HPI. Comprehensive review of systems was otherwise negative.  Objective:  Physical Exam BP 125/77 mmHg  Pulse 75  Wt 185 lb (83.915 kg)  LMP 06/03/2014 CONSTITUTIONAL: Well-developed, well-nourished female in no acute distress.  HENT:  Normocephalic, atraumatic, External right and left ear normal. Oropharynx is clear and moist EYES: Conjunctivae and EOM are normal. Pupils are equal, round, and reactive to light. No scleral icterus.  NECK: Normal range of motion, supple, no masses SKIN: Skin is warm and dry. No rash noted. Not diaphoretic. No erythema. No pallor. [place]: Alert and oriented to person, place, and time. Normal reflexes, muscle tone coordination. No cranial nerve deficit noted. PSYCHIATRIC: Normal mood and affect. Normal behavior. Normal judgment and thought content. CARDIOVASCULAR: Normal heart rate  noted RESPIRATORY: Effort and breath sounds normal, no problems with respiration noted ABDOMEN: Soft, no distention noted.  No tenderness, rebound or guarding.  PELVIC:  Deferred. MUSCULOSKELETAL: Normal range of motion. No edema and no tenderness.  Labs and Imaging 11/20/2014   CLINICAL DATA:  34 year old female with elevated prolactin, headaches, irregular menstrual cycle. Initial encounter.  EXAM: MRI HEAD WITHOUT AND WITH CONTRAST  TECHNIQUE: Multiplanar, multiecho pulse sequences of the brain and surrounding structures were obtained without and with intravenous contrast.  CONTRAST:  11mL MULTIHANCE GADOBENATE DIMEGLUMINE 529 MG/ML IV SOLN  COMPARISON:  None.  FINDINGS: Cerebral volume is normal. Major intracranial vascular flow voids are within normal limits. No restricted diffusion to suggest acute infarction. No midline shift, mass effect, evidence of mass lesion, ventriculomegaly, extra-axial collection or acute intracranial hemorrhage. Cervicomedullary junction within normal limits. Negative visualized cervical spine. [place] Cables and white matter signal is within normal limits throughout the brain. No abnormal gray or white matter enhancement.  Visualized scalp soft tissues are within normal limits. Visualized bone marrow signal is within normal limits. Orbits soft tissues appear normal. Mild mostly maxillary sinus mucosal thickening. Mastoids are clear.  Dedicated pituitary imaging. Overall pituitary gland size is within normal limits. The infundibulum is normal. No suprasellar mass or mass effect. Normal hypothalamus. However, on both dynamic and delayed post-contrast imaging there is an oval region of hypoenhancement along the inferior left aspect of the gland encompassing 8 x 9 x 5 mm (AP by transverse by CC). See series 10, image 12, series 13, image 4 and series 12, image 8. The left cavernous sinus does  not appear involved. The right cavernous sinus also appears normal. No other abnormal enhancement.   IMPRESSION: 1. Nine mm hypoenhancing pituitary lesion most compatible with a microadenoma. No cavernous sinus involvement or complicating features. 2. Otherwise normal MRI appearance of the brain.   Electronically Signed   By: Genevie Ann M.D.   On: 11/20/2014 10:35   10/24/2014 CLINICAL DATA: Irregular cycles for 2 years EXAM: TRANSABDOMINAL AND TRANSVAGINAL ULTRASOUND OF PELVIS TECHNIQUE: Both transabdominal and transvaginal ultrasound examinations of the pelvis were performed. Transabdominal technique was performed for global imaging of the pelvis including uterus, ovaries, adnexal regions, and pelvic cul-de-sac. It was necessary to proceed with endovaginal exam following the transabdominal exam to visualize the endometrium. COMPARISON: 09/21/2011 FINDINGS: Uterus Measurements: 9.0 x 3.4 x 5.4 cm. No fibroids or other mass visualized. Endometrium Thickness: 5 mm in thickness. No focal abnormality visualized. Right ovary Measurements: 2.4 x 2.3 x 2.2 cm. Normal appearance/no adnexal mass. Left ovary Measurements: 2.9 x 1.9 x 1.9 cm. Normal appearance/no adnexal mass. Other findings No free fluid. IMPRESSION: Unremarkable pelvic ultrasound. Electronically Signed By: Rolm Baptise M.D. On: 10/24/2014 10:22    Assessment & Plan:  Patient has a pituitary microadenoma. This was explained to patient, need for treatment emphasized.  Referred to Endocrinology for further treatment and evaluation. Will follow up response. Normal pelvic ultrasound; she was told to return for any other GYN concerns. Routine preventative health maintenance measures emphasized. Please refer to After Visit Summary for other counseling recommendations.    Verita Schneiders, MD, [place] Attending [place] for [employer], [place]

## 2015-01-01 ENCOUNTER — Encounter: Payer: Self-pay | Admitting: Endocrinology

## 2015-01-01 ENCOUNTER — Ambulatory Visit (INDEPENDENT_AMBULATORY_CARE_PROVIDER_SITE_OTHER): Payer: 59 | Admitting: Endocrinology

## 2015-01-01 VITALS — BP 126/74 | HR 64 | Temp 97.6°F | Resp 16 | Ht 61.0 in | Wt 185.0 lb

## 2015-01-01 DIAGNOSIS — R51 Headache: Secondary | ICD-10-CM | POA: Diagnosis not present

## 2015-01-01 DIAGNOSIS — D352 Benign neoplasm of pituitary gland: Secondary | ICD-10-CM | POA: Diagnosis not present

## 2015-01-01 DIAGNOSIS — G8929 Other chronic pain: Secondary | ICD-10-CM

## 2015-01-01 MED ORDER — CABERGOLINE 0.5 MG PO TABS: 0.2500 mg | ORAL_TABLET | ORAL | Status: DC

## 2015-01-01 NOTE — Progress Notes (Signed)
Patient ID: [name], female   DOB: [birth year], 34 y.o.   MRN: [id number]   Chief complaint: No menstrual cycles  History of Present Illness:  She had been trying to conceive for approximately 3 years without any success.  Her periods were normal for two to three years after she had her daughter about 4 years ago.  She thinks she had normal regular monthly cycles last year until December. She has not had a cycle since June 05, 2014 Also she has noticed that her milk discharge from her breasts has continued since her last pregnancy and is about the same; she will get some milk discharge when she is lying on her side or when she is bathing.     Because of her lack of menstrual cycles and difficulty conceiving she saw her gynecologist recently who did a full endocrine evaluation   On 10/10/14 she had a fasting prolactin level of 167.  Also has a 9 mm pituitary microadenoma on her MRI done in 10/2014   She is now referred here for further management    Past Medical History  Diagnosis Date  . 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  . Typhoid fever     hx of s/pabdominal  surgery for complication.  . Irregular menses     followed by Dr Kalman Shan and [facility] infertility clinic  . Depression   . 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 UTI (lower urinary tract infection)     Past Surgical History  Procedure Laterality Date  . Abdominal surgery      R/T typhoid fever 2002 and revision in 2004    Family History  Problem Relation Age of Onset  . Cancer Maternal Aunt     breast cancer at age 30yrs  . Diabetes Neg Hx     Social History:  reports that she has never smoked. She has never used smokeless tobacco. She reports that she drinks alcohol. She reports that she does not use illicit drugs.  Allergies:  Allergies  Allergen Reactions  . Ibuprofen Other (See Comments)    Chest pain        Medication List       This list is accurate as of: 01/01/15 12:57 PM.  Always use your most recent med list.               acetaminophen 325 MG tablet  Commonly known as:  TYLENOL  Take 650 mg by mouth every 6 (six) hours as needed. For pain.     cabergoline 0.5 MG tablet  Commonly known as:  DOSTINEX  Take 0.5 tablets (0.25 mg total) by mouth 2 (two) times a week.     PRENATAL VITAMINS PO  Take by mouth.        LABS:  No visits with results within 1 Week(s) from this visit. Latest known visit with results is:  Office Visit on 10/10/2014  Component Date Value Ref Range Status  . Testosterone 10/10/2014 36  10 - 70 ng/dL Final   Comment:           Tanner Stage       Female              Female               I              < 30  ng/dL        < 10 ng/dL               II             < 150 ng/dL       < 30 ng/dL               III            100-320 ng/dL     < 35 ng/dL               IV             200-970 ng/dL     15-40 ng/dL               V/Adult        300-890 ng/dL     10-70 ng/dL     . Sex Hormone Binding 10/10/2014 39  17 - 124 nmol/L Final  . Testosterone, Free 10/10/2014 5.8  0.6 - 6.8 pg/mL Final   Comment:   The concentration of free testosterone is derived from a mathematical expression based on constants for the binding of testosterone to sex hormone-binding globulin and albumin.   . Testosterone-% Free 10/10/2014 1.6  0.4 - 2.4 % Final  . TSH 10/10/2014 0.861  0.350 - 4.500 uIU/mL Final  . Prolactin 10/10/2014 167.6   Final   Comment: Result repeated and verified. Result confirmed by automatic dilution.      Reference Ranges:                  Female:                       2.1 -  17.1 ng/ml                  Female:   Pregnant          9.7 - 208.5 ng/mL                            Non Pregnant      2.8 -  29.2 ng/mL                            Post Menopausal   1.8 -  20.3 ng/mL                      . Hgb A1c MFr Bld 10/10/2014 5.7* <5.7 % Final   Comment:                                                                         According to the ADA Clinical Practice Recommendations for 2011, when HbA1c is used as a screening test:     >=6.5%   Diagnostic of Diabetes Mellitus            (if abnormal result is confirmed)   5.7-6.4%   Increased risk of developing Diabetes Mellitus   References:Diagnosis and Classification of Diabetes Mellitus,Diabetes [id number],0254,27(CWCBJ 1):S62-S69 and Standards of Medical Care in         Diabetes -  2011,Diabetes [id number],8315,17 (Suppl 1):S11-S61.     . Mean Plasma Glucose 10/10/2014 117* <117 mg/dL Final  . hCG, Beta Chain, Quant, S 10/10/2014 <2.0   Final   Comment:    Males and non-pregnant females       < 5   mIU/mL  Gestation Age               Concentration (mIU/mL)    <= 1 Week                       5 - 50       2 Weeks                     50 - 500       3 Weeks                    100 - 10,000       4 Weeks                  1,000 - 30,000       5 Weeks                  3,500 - 115,000     6-8 Weeks                 12,000 - 270,000      12 Weeks                 15,000 - 220,000   . [facility] 10/10/2014 8.5   Final   Comment: Reference Ranges:          Female:                         1.4 -  18.1 mIU/mL          Female:   Follicular Phase    2.5 -  10.2 mIU/mL                    MidCycle Peak       3.4 -  33.4 mIU/mL                    Luteal Phase        1.5 -   9.1 mIU/mL                    Post Menopausal    23.0 - 116.3 mIU/mL                    Pregnant                <   0.3 mIU/mL      REVIEW OF SYSTEMS:        Constitutional: no complaints of unusual fatigue   She thinks she may have lost 5 pounds this year  Wt Readings from Last 3 Encounters:  01/01/15 185 lb (83.915 kg)  11/28/14 185 lb (83.915 kg)  11/06/14 187 lb 9.6 oz (85.095 kg)    Eyes: no history of blurred vision including peripheral vision   ENT: no nasal congestion   Cardiovascular:   she may occasionally have mild leg swelling.   No history of high blood pressure  Respiratory: no cough/shortness of breath  Gastrointestinal: no change in appetite, bowel movements  or abdominal pain  Musculoskeletal: no muscle/joint aches  Neurological: she complains of headaches for the last 2-3 years which occur at least 3-4 times a week She has difficulty describing the headache but they are mostly on one side or the other without any nausea.  She usually has to take aspirin or Advil to relieve the headache.  She thinks the severity of headaches or significant unless she takes her medication and otherwise headaches will last for about 3 hours  Endocrine: No heat or cold intolerance    PHYSICAL EXAM:  BP 126/74 mmHg  Pulse 64  Temp(Src) 97.6 F (36.4 C)  Resp 16  Ht 5\' 1"  (1.549 m)  Wt 185 lb (83.915 kg)  BMI 34.97 kg/m2  SpO2 96%  GENERAL: well-built and nourished, mild generalized obesity present   No pallor, clubbing, lymphadenopathy or edema.   Skin:  no rash or  abnormal pigmentation. no significant hirsutism or acne   EYES:  Externally normal.  Fundii:  normal discs and vessels. Her peripheral vision appears to be grossly normal on confrontation   ENT: Oral mucosa and tongue normal.  THYROID:  Not palpable.  HEART:  Normal  S1 and S2; no murmur or click.  CHEST:  Normal shape.  Lungs: Vescicular breath sounds heard equally.  No crepitations/ wheeze.  ABDOMEN:  exam not indicated   NEUROLOGICAL: .deep tendon reflexes  are bilaterally at ankles.  JOINTS:  Normal.  ASSESSMENT:     PROLACTINOMA causing amenorrhea and mild galactorrhea. She has a significantly high prolactin level of 167 Discussed in detail the nature of prolactinoma, implications for altering her normal pituitary-ovarian axis function and infertility. Discussed options for treatment and medications available All her questions regarding the diagnosis and management were answered  HEADACHES: Unclear etiology.  May or may not be related  to her prolactinoma as the characteristics of the headache are somewhat atypical and may be migrainous  PLAN:   Start Dostinex 0.25 mg twice a week with food.  Discussed actions of the medication and possible side effects of nausea and dizziness. Advised her to use contraception to prevent any pregnancy as this would be inadvisable for at least a year or 2  Return in 6 weeks with repeat prolactin level for titration of her medication  Caral Whan 01/01/2015, 12:57 PM

## 2015-02-06 ENCOUNTER — Other Ambulatory Visit (INDEPENDENT_AMBULATORY_CARE_PROVIDER_SITE_OTHER): Payer: 59

## 2015-02-06 DIAGNOSIS — D352 Benign neoplasm of pituitary gland: Secondary | ICD-10-CM | POA: Diagnosis not present

## 2015-02-06 LAB — T4, FREE: FREE T4: 0.84 ng/dL (ref 0.60–1.60)

## 2015-02-07 LAB — PROLACTIN: PROLACTIN: 37.9 ng/mL

## 2015-02-12 ENCOUNTER — Encounter: Payer: Self-pay | Admitting: Endocrinology

## 2015-02-12 ENCOUNTER — Ambulatory Visit (INDEPENDENT_AMBULATORY_CARE_PROVIDER_SITE_OTHER): Payer: 59 | Admitting: Endocrinology

## 2015-02-12 ENCOUNTER — Other Ambulatory Visit: Payer: Self-pay | Admitting: *Deleted

## 2015-02-12 VITALS — BP 124/84 | HR 99 | Temp 97.7°F | Resp 14 | Ht 61.0 in | Wt 181.4 lb

## 2015-02-12 DIAGNOSIS — D352 Benign neoplasm of pituitary gland: Secondary | ICD-10-CM | POA: Diagnosis not present

## 2015-02-12 NOTE — Patient Instructions (Addendum)
Mondays; take 1/2 and 1 on Thursdays  Avoid pregnancy for now

## 2015-02-12 NOTE — Progress Notes (Signed)
Patient ID: [name], female   DOB: 1981-04-19, 34 y.o.   MRN: [id number]   Chief complaint: Follow-up of hyperprolactinemia  History of Present Illness:  Background history: She had been trying to conceive for approximately 3 years without any success.  Her periods were normal for two to three years after she had her daughter about 4 years ago.  She thinks she had normal regular monthly cycles last year until December. She has not had a cycle since June 05, 2014 Also she has noticed that her milk discharge from her breasts has continued since her last pregnancy and is about the same; she will get some milk discharge when she is lying on her side or when she is bathing.     Because of her lack of menstrual cycles and difficulty conceiving she saw her gynecologist recently who found her to have a fasting prolactin level of 167 in 4/16. Also has a 9 mm pituitary microadenoma on her MRI done in 10/2014  She was started on Dostinex 0.5 mg, half tablet twice a week in 7/16 which she is taking on Mondays and Wednesdays She does not have any nausea or dizziness or any difficulties taking the medication and is compliant with this  With this she started having a menstrual cycle this month She also has less discharge from her breasts   Appointment on 02/06/2015  Component Date Value Ref Range Status  . Prolactin 02/06/2015 37.9   Final   Comment:      Reference Ranges:                  Female:                       2.1 -  17.1 ng/ml                  Female:   Pregnant          9.7 - 208.5 ng/mL                            Non Pregnant      2.8 -  29.2 ng/mL                            Post Menopausal   1.8 -  20.3 ng/mL                      . Free T4 02/06/2015 0.84  0.60 - 1.60 ng/dL Final       Past Medical History  Diagnosis Date  . 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  . Typhoid fever     hx of  s/pabdominal  surgery for complication.  . Irregular menses     followed by Dr Kalman Shan and [facility] infertility clinic  . Depression   . 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 UTI (lower urinary tract infection)     Past Surgical History  Procedure Laterality Date  . Abdominal surgery      R/T typhoid fever 2002 and revision in 2004    Family History  Problem Relation Age of Onset  . Cancer Maternal Aunt     breast cancer at age 82yrs  . Diabetes Neg Hx     Social History:  reports  that she has never smoked. She has never used smokeless tobacco. She reports that she drinks alcohol. She reports that she does not use illicit drugs.  Allergies:  Allergies  Allergen Reactions  . Ibuprofen Other (See Comments)    Chest pain      Medication List       This list is accurate as of: 02/12/15 10:26 AM.  Always use your most recent med list.               acetaminophen 325 MG tablet  Commonly known as:  TYLENOL  Take 650 mg by mouth every 6 (six) hours as needed. For pain.     cabergoline 0.5 MG tablet  Commonly known as:  DOSTINEX  Take 0.5 tablets (0.25 mg total) by mouth 2 (two) times a week.     PRENATAL VITAMINS PO  Take by mouth.        LABS:  Appointment on 02/06/2015  Component Date Value Ref Range Status  . Prolactin 02/06/2015 37.9   Final   Comment:      Reference Ranges:                  Female:                       2.1 -  17.1 ng/ml                  Female:   Pregnant          9.7 - 208.5 ng/mL                            Non Pregnant      2.8 -  29.2 ng/mL                            Post Menopausal   1.8 -  20.3 ng/mL                      . Free T4 02/06/2015 0.84  0.60 - 1.60 ng/dL Final     REVIEW OF SYSTEMS:         Has some fluctuation in weight, minor  Wt Readings from Last 3 Encounters:  02/12/15 181 lb 6.4 oz (82.283 kg)  01/01/15 185 lb (83.915 kg)  11/28/14 185 lb (83.915 kg)    Information from previous history:    Neurological: she complains of headaches for the last 2-3 years which occur at least 3-4 times a week She has difficulty describing the headache but they are mostly on one side or the other without any nausea.  She usually has to take aspirin or Advil to relieve the headache.  She thinks the severity of headaches or significant unless she takes her medication and otherwise headaches will last for about 3 hours     PHYSICAL EXAM:  BP 124/84 mmHg  Pulse 99  Temp(Src) 97.7 F (36.5 C)  Resp 14  Ht 5\' 1"  (1.549 m)  Wt 181 lb 6.4 oz (82.283 kg)  BMI 34.29 kg/m2  SpO2 73%  Exam not indicated   ASSESSMENT:     PROLACTINOMA causing amenorrhea and mild galactorrhea. She has a significantly high baseline prolactin level of 167  With starting Dostinex 0.25 mg twice a week she is now starting to have a menstrual cycle and less galactorrhea She is compliant with  this and tolerating this well  HEADACHES: These were preceding her problems with amenorrhea and probably unrelated  PLAN:  Continue cabergoline but increase her dose to 0.5 mg on Monday and 0.25 mg on the other day of the week Advised her to use contraception to prevent any pregnancy as this would be inadvisable for at least a year even though she is keen on conceiving soon  Return in 8 weeks with repeat prolactin level for titration of her medication  [name] 02/12/2015, 10:26 AM

## 2015-03-11 ENCOUNTER — Telehealth: Payer: Self-pay | Admitting: Endocrinology

## 2015-03-11 NOTE — Telephone Encounter (Signed)
Pt has been itching vaginally and has what looks like pimples-referred her to her PCP or OBGYN

## 2015-03-11 NOTE — Telephone Encounter (Signed)
She will see gynecologist

## 2015-03-11 NOTE — Telephone Encounter (Signed)
Please advise below.

## 2015-03-12 ENCOUNTER — Other Ambulatory Visit: Payer: Self-pay

## 2015-03-12 ENCOUNTER — Telehealth: Payer: Self-pay | Admitting: Endocrinology

## 2015-03-12 MED ORDER — CABERGOLINE 0.5 MG PO TABS: 0.2500 mg | ORAL_TABLET | ORAL | Status: DC

## 2015-03-12 NOTE — Telephone Encounter (Signed)
Left pt a Vm after calling pharmacy to confirm that she has this refill at pharmacy

## 2015-03-12 NOTE — Telephone Encounter (Signed)
Please call the dostinex into [facility]

## 2015-03-12 NOTE — Telephone Encounter (Signed)
Pt informed that she has a refill on requested medication

## 2015-03-12 NOTE — Telephone Encounter (Signed)
Patient returning your call.

## 2015-04-11 ENCOUNTER — Other Ambulatory Visit: Payer: 59

## 2015-04-11 DIAGNOSIS — D352 Benign neoplasm of pituitary gland: Secondary | ICD-10-CM

## 2015-04-12 LAB — PROLACTIN: Prolactin: 27.9 ng/mL — ABNORMAL HIGH (ref 4.8–23.3)

## 2015-04-15 ENCOUNTER — Encounter: Payer: Self-pay | Admitting: Endocrinology

## 2015-04-15 ENCOUNTER — Ambulatory Visit (INDEPENDENT_AMBULATORY_CARE_PROVIDER_SITE_OTHER): Payer: 59 | Admitting: Endocrinology

## 2015-04-15 VITALS — BP 108/76 | HR 72 | Temp 97.8°F | Resp 14 | Ht 61.0 in | Wt 185.0 lb

## 2015-04-15 DIAGNOSIS — D352 Benign neoplasm of pituitary gland: Secondary | ICD-10-CM

## 2015-04-15 MED ORDER — CABERGOLINE 0.5 MG PO TABS: 0.5000 mg | ORAL_TABLET | ORAL | Status: DC

## 2015-04-15 NOTE — Progress Notes (Signed)
Patient ID: [name], female   DOB: 29-Nov-1980, 34 y.o.   MRN: [id number]   Chief complaint: Follow-up of hyperprolactinemia  History of Present Illness:  Background history: She had been trying to conceive for approximately 3 years without any success.  Her periods were normal for two to three years after she had her daughter about 4 years ago.  She thinks she had normal regular monthly cycles last year until December. She has not had a cycle since June 05, 2014 Also she has noticed that her milk discharge from her breasts has continued since her last pregnancy and is about the same; she will get some milk discharge when she is lying on her side or when she is bathing.     Because of her lack of menstrual cycles and difficulty conceiving she saw her gynecologist who found her to have a fasting prolactin level of 167 in 4/16. Also has a 9 mm pituitary microadenoma on her MRI done in 10/2014  Recent history: She was started on Dostinex 0.5 mg, half tablet twice a week in 7/16 which she is taking on Mondays and Thursdays  This was increased to 0.5 mg on Monday and 0.25 mg on Thursdays on her last visit in 8/16 since she was still having relatively high prolactin level and mild galactorrhea  She has had regular menstrual cycles now She also has less discharge from her breasts, minimal compared to before No side effects from Dostinex, occasionally at times she will have left lower abdominal discomfort   Appointment on 04/11/2015  Component Date Value Ref Range Status  . Prolactin 04/11/2015 27.9* 4.8 - 23.3 ng/mL Final       Past Medical History  Diagnosis Date  . 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  . Typhoid fever     hx of s/pabdominal  surgery for complication.  . Irregular menses     followed by Dr Kalman Shan and [facility] infertility clinic  . Depression   . Axillary mass     treated with doxy  05/2006  . UTI (lower urinary tract infection)     Past Surgical History  Procedure Laterality Date  . Abdominal surgery      R/T typhoid fever 2002 and revision in 2004    Family History  Problem Relation Age of Onset  . Cancer Maternal Aunt     breast cancer at age 69yrs  . Diabetes Neg Hx     Social History:  reports that she has never smoked. She has never used smokeless tobacco. She reports that she drinks alcohol. She reports that she does not use illicit drugs.  Allergies:  Allergies  Allergen Reactions  . Ibuprofen Other (See Comments)    Chest pain      Medication List       This list is accurate as of: 04/15/15 10:46 AM.  Always use your most recent med list.               acetaminophen 325 MG tablet  Commonly known as:  TYLENOL  Take 650 mg by mouth every 6 (six) hours as needed. For pain.     cabergoline 0.5 MG tablet  Commonly known as:  DOSTINEX  Take 0.5 tablets (0.25 mg total) by mouth 2 (two) times a week.     PRENATAL VITAMINS PO  Take by mouth.        LABS:  Appointment on 04/11/2015  Component Date Value Ref Range Status  . Prolactin 04/11/2015 27.9* 4.8 - 23.3 ng/mL Final     REVIEW OF SYSTEMS:         Has some fluctuation in weight, minor  Wt Readings from Last 3 Encounters:  04/15/15 185 lb (83.915 kg)  02/12/15 181 lb 6.4 oz (82.283 kg)  01/01/15 185 lb (83.915 kg)      Neurological: she was having complaints of headaches for the last 2-3 years which occur at least 3-4 times a week These are less often and not as severe also    PHYSICAL EXAM:  BP 108/76 mmHg  Pulse 72  Temp(Src) 97.8 F (36.6 C)  Resp 14  Ht 5\' 1"  (1.549 m)  Wt 185 lb (83.915 kg)  BMI 34.97 kg/m2  SpO2 97%  Exam not indicated   ASSESSMENT:     PROLACTINOMA causing amenorrhea and mild galactorrhea. She has a significantly high baseline prolactin level of 167  With Dostinex her menstrual cycles have been regular recently and her  galactorrhea has improved further She is compliant with this and tolerating this well Her prolactin level is gradually coming down but is still slightly high with taking 0.5 mg alternating with 0.25 mg, 2 days a week She is still interested in planning a pregnancy   PLAN:  Continue cabergoline but increase her dose to 0.5 mg on Monday and 0.5 mg on the other day of the week Advised her to use contraception to prevent any pregnancy as this would be inadvisable for at least a year and preferably until she is able to wean off her medication  Return in 12 weeks with repeat prolactin level   Denis Carreon 04/15/2015, 10:46 AM

## 2015-04-15 NOTE — Patient Instructions (Signed)
Take full tab on 2 days a week

## 2015-05-11 ENCOUNTER — Encounter (HOSPITAL_COMMUNITY): Payer: Self-pay | Admitting: Emergency Medicine

## 2015-05-11 ENCOUNTER — Emergency Department (HOSPITAL_COMMUNITY): Payer: Medicaid Other

## 2015-05-11 ENCOUNTER — Emergency Department (HOSPITAL_COMMUNITY)
Admission: EM | Admit: 2015-05-11 | Discharge: 2015-05-11 | Disposition: A | Discharge status: Home / Self Care | Payer: Medicaid Other | Attending: Emergency Medicine | Admitting: Emergency Medicine

## 2015-05-11 DIAGNOSIS — J189 Pneumonia, unspecified organism: Secondary | ICD-10-CM

## 2015-05-11 DIAGNOSIS — Z8742 Personal history of other diseases of the female genital tract: Secondary | ICD-10-CM | POA: Insufficient documentation

## 2015-05-11 DIAGNOSIS — Z8619 Personal history of other infectious and parasitic diseases: Secondary | ICD-10-CM | POA: Insufficient documentation

## 2015-05-11 DIAGNOSIS — J159 Unspecified bacterial pneumonia: Secondary | ICD-10-CM | POA: Diagnosis not present

## 2015-05-11 DIAGNOSIS — Z87440 Personal history of urinary (tract) infections: Secondary | ICD-10-CM | POA: Diagnosis not present

## 2015-05-11 DIAGNOSIS — R05 Cough: Secondary | ICD-10-CM | POA: Diagnosis present

## 2015-05-11 DIAGNOSIS — Z8659 Personal history of other mental and behavioral disorders: Secondary | ICD-10-CM | POA: Insufficient documentation

## 2015-05-11 LAB — BASIC METABOLIC PANEL
Anion gap: 9 (ref 5–15)
CHLORIDE: 102 mmol/L (ref 101–111)
CO2: 23 mmol/L (ref 22–32)
CREATININE: 0.75 mg/dL (ref 0.44–1.00)
Calcium: 9.3 mg/dL (ref 8.9–10.3)
GFR calc Af Amer: >60 mL/min (ref >60)
GFR calc non Af Amer: >60 mL/min (ref >60)
GLUCOSE: 78 mg/dL (ref 65–99)
Potassium: 4 mmol/L (ref 3.5–5.1)
SODIUM: 134 mmol/L — AB (ref 135–145)

## 2015-05-11 LAB — CBC WITH DIFFERENTIAL/PLATELET
Basophils Absolute: 0 10*3/uL (ref 0.0–0.1)
Basophils Relative: 0 %
EOS ABS: 0 10*3/uL (ref 0.0–0.7)
EOS PCT: 0 %
HCT: 34.4 % — ABNORMAL LOW (ref 36.0–46.0)
Hemoglobin: 11.2 g/dL — ABNORMAL LOW (ref 12.0–15.0)
LYMPHS ABS: 1 10*3/uL (ref 0.7–4.0)
Lymphocytes Relative: 19 %
MCH: 28.5 pg (ref 26.0–34.0)
MCHC: 32.6 g/dL (ref 30.0–36.0)
MCV: 87.5 fL (ref 78.0–100.0)
MONO ABS: 0.6 10*3/uL (ref 0.1–1.0)
MONOS PCT: 12 %
Neutro Abs: 3.5 10*3/uL (ref 1.7–7.7)
Neutrophils Relative %: 69 %
PLATELETS: 279 10*3/uL (ref 150–400)
RBC: 3.93 MIL/uL (ref 3.87–5.11)
RDW: 11.9 % (ref 11.5–15.5)
WBC: 5.1 10*3/uL (ref 4.0–10.5)

## 2015-05-11 MED ORDER — HYDROCODONE-HOMATROPINE 5-1.5 MG/5ML PO SYRP: 5.0000 mL | ORAL_SOLUTION | Freq: Four times a day (QID) | ORAL | Status: DC | PRN

## 2015-05-11 MED ORDER — AZITHROMYCIN 250 MG PO TABS: ORAL_TABLET | ORAL | Status: DC

## 2015-05-11 MED ORDER — AZITHROMYCIN 250 MG PO TABS
500.0000 mg | ORAL_TABLET | Freq: Once | ORAL | Status: AC
  Administered 2015-05-11: 500 mg via ORAL
  Filled 2015-05-11: qty 2

## 2015-05-11 NOTE — ED Notes (Signed)
Pt. Stated, I cough starting on 3 days ago I feel fatigue and not able to sleep cause i cough

## 2015-05-11 NOTE — ED Provider Notes (Signed)
CSN: [id number]     Arrival date & time 05/11/15  1032 History   First MD Initiated Contact with Patient 05/11/15 1043     Chief Complaint  Patient presents with  . Cough  . Fatigue  . Nausea      HPI Pt. Stated, I cough starting on 3 days ago I feel fatigue and not able to sleep cause i cough Past Medical History  Diagnosis Date  . 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  . Typhoid fever     hx of s/pabdominal  surgery for complication.  . Irregular menses     followed by Dr Kalman Shan and [facility] infertility clinic  . Depression   . 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 UTI (lower urinary tract infection)    Past Surgical History  Procedure Laterality Date  . Abdominal surgery      R/T typhoid fever 2002 and revision in 2004   Family History  Problem Relation Age of Onset  . Cancer Maternal Aunt     breast cancer at age 33yrs  . Diabetes Neg Hx    Social History  Substance Use Topics  . Smoking status: Never Smoker   . Smokeless tobacco: Never Used  . Alcohol Use: 0.0 oz/week    0 Standard drinks or equivalent per week     Comment: Occasionally   OB History    Gravida Para Term Preterm AB TAB SAB Ectopic Multiple Living   2 2 2       2      Review of Systems  All other systems reviewed and are negative.     Allergies  Ibuprofen  Home Medications   Prior to Admission medications   Medication Sig Start Date End Date Taking? Authorizing Provider  acetaminophen (TYLENOL) 325 MG tablet Take 650 mg by mouth every 6 (six) hours as needed. For pain.   Yes Historical Provider, MD  cabergoline (DOSTINEX) 0.5 MG tablet Take 1 tablet (0.5 mg total) by mouth 2 (two) times a week. 04/15/15  Yes Elayne Snare, MD  Naproxen Sodium (ALEVE) 220 MG CAPS Take 220 mg by mouth every 8 (eight) hours as needed (pain).   Yes Historical Provider, MD  Prenatal Multivit-Min-Fe-FA (PRENATAL VITAMINS PO) Take by  mouth.   Yes Historical Provider, MD  azithromycin (ZITHROMAX) 250 MG tablet Take 1 tablet daily until gone starting tomorrow 05/11/15   Leonard Schwartz, MD   BP 105/50 mmHg  Pulse 85  Temp(Src) 98.6 F (37 C) (Oral)  Resp 18  Ht 5' 1.5" (1.562 m)  Wt 181 lb 3 oz (82.186 kg)  BMI 33.68 kg/m2  SpO2 99%  LMP 04/29/2015 Physical Exam  Constitutional: She is oriented to person, place, and time. She appears well-developed and well-nourished. No distress.  HENT:  Head: Normocephalic and atraumatic.  Eyes: Pupils are equal, round, and reactive to light.  Neck: Normal range of motion.  Cardiovascular: Normal rate and intact distal pulses.   Pulmonary/Chest: No respiratory distress. She has wheezes (Mild expiratory in both bases).  Abdominal: Normal appearance. She exhibits no distension.  Musculoskeletal: Normal range of motion.  Neurological: She is alert and oriented to person, place, and time. No cranial nerve deficit.  Skin: Skin is warm and dry. No rash noted.  Psychiatric: She has a normal mood and affect. Her behavior is normal.  Nursing note and vitals reviewed.   ED Course  Procedures (  including critical care time) Medications  azithromycin (ZITHROMAX) tablet 500 mg (not administered)    Labs Review Labs Reviewed  BASIC METABOLIC PANEL - Abnormal; Notable for the following:    Sodium 134 (*)    BUN <5 (*)    All other components within normal limits  CBC WITH DIFFERENTIAL/PLATELET - Abnormal; Notable for the following:    Hemoglobin 11.2 (*)    HCT 34.4 (*)    All other components within normal limits    Imaging Review Dg Chest 2 View  05/11/2015  CLINICAL DATA:  Started coughing 3 days ago. EXAM: CHEST  2 VIEW COMPARISON:  None. FINDINGS: Patchy densities in the left perihilar region are concerning for airspace disease. These densities may be extending into the anterior left upper lobe based on the lateral view. The right lung is clear. Heart size is normal. No  pleural effusions. IMPRESSION: Patchy densities in the left perihilar region are suggestive for pneumonia. Electronically Signed   By: Markus Daft M.D.   On: 05/11/2015 11:41   I have personally reviewed and evaluated these images and lab results as part of my medical decision-making.    MDM   Final diagnoses:  CAP (community acquired pneumonia)        Leonard Schwartz, MD 05/11/15 1205

## 2015-05-11 NOTE — ED Notes (Signed)
Phlebotomy at bedside.

## 2015-05-11 NOTE — ED Notes (Signed)
Pt is in stable condition upon d/c and ambulates from ED. 

## 2015-05-17 ENCOUNTER — Encounter (HOSPITAL_COMMUNITY): Payer: Self-pay

## 2015-05-17 ENCOUNTER — Emergency Department (HOSPITAL_COMMUNITY): Payer: 59

## 2015-05-17 DIAGNOSIS — Z8659 Personal history of other mental and behavioral disorders: Secondary | ICD-10-CM | POA: Insufficient documentation

## 2015-05-17 DIAGNOSIS — Z8619 Personal history of other infectious and parasitic diseases: Secondary | ICD-10-CM | POA: Diagnosis not present

## 2015-05-17 DIAGNOSIS — Z79899 Other long term (current) drug therapy: Secondary | ICD-10-CM | POA: Diagnosis not present

## 2015-05-17 DIAGNOSIS — Z87440 Personal history of urinary (tract) infections: Secondary | ICD-10-CM | POA: Insufficient documentation

## 2015-05-17 DIAGNOSIS — Z8742 Personal history of other diseases of the female genital tract: Secondary | ICD-10-CM | POA: Diagnosis not present

## 2015-05-17 DIAGNOSIS — R05 Cough: Secondary | ICD-10-CM | POA: Diagnosis not present

## 2015-05-17 DIAGNOSIS — R51 Headache: Secondary | ICD-10-CM | POA: Diagnosis not present

## 2015-05-17 DIAGNOSIS — R0602 Shortness of breath: Secondary | ICD-10-CM | POA: Diagnosis not present

## 2015-05-17 LAB — CBC
HEMATOCRIT: 37 % (ref 36.0–46.0)
Hemoglobin: 12.2 g/dL (ref 12.0–15.0)
MCH: 28.4 pg (ref 26.0–34.0)
MCHC: 33 g/dL (ref 30.0–36.0)
MCV: 86.2 fL (ref 78.0–100.0)
Platelets: 342 10*3/uL (ref 150–400)
RBC: 4.29 MIL/uL (ref 3.87–5.11)
RDW: 11.6 % (ref 11.5–15.5)
WBC: 5.6 10*3/uL (ref 4.0–10.5)

## 2015-05-17 LAB — COMPREHENSIVE METABOLIC PANEL
ALBUMIN: 4.1 g/dL (ref 3.5–5.0)
ALT: 15 U/L (ref 14–54)
ANION GAP: 10 (ref 5–15)
AST: 20 U/L (ref 15–41)
Alkaline Phosphatase: 46 U/L (ref 38–126)
BILIRUBIN TOTAL: 0.1 mg/dL — AB (ref 0.3–1.2)
BUN: 13 mg/dL (ref 6–20)
CO2: 21 mmol/L — ABNORMAL LOW (ref 22–32)
Calcium: 9.5 mg/dL (ref 8.9–10.3)
Chloride: 102 mmol/L (ref 101–111)
Creatinine, Ser: 0.83 mg/dL (ref 0.44–1.00)
GFR calc Af Amer: >60 mL/min (ref >60)
GFR calc non Af Amer: >60 mL/min (ref >60)
GLUCOSE: 88 mg/dL (ref 65–99)
POTASSIUM: 3.9 mmol/L (ref 3.5–5.1)
SODIUM: 133 mmol/L — AB (ref 135–145)
TOTAL PROTEIN: 7.2 g/dL (ref 6.5–8.1)

## 2015-05-17 NOTE — ED Notes (Signed)
Pt seen for cough 6 days ago and given abx but no relief,was dx with pna and feeling worse.

## 2015-05-18 ENCOUNTER — Emergency Department (HOSPITAL_COMMUNITY)
Admission: EM | Admit: 2015-05-18 | Discharge: 2015-05-18 | Disposition: A | Discharge status: Home / Self Care | Payer: 59 | Attending: Emergency Medicine | Admitting: Emergency Medicine

## 2015-05-18 DIAGNOSIS — R059 Cough, unspecified: Secondary | ICD-10-CM

## 2015-05-18 DIAGNOSIS — R05 Cough: Secondary | ICD-10-CM

## 2015-05-18 MED ORDER — PREDNISONE 20 MG PO TABS
40.0000 mg | ORAL_TABLET | Freq: Once | ORAL | Status: AC
  Administered 2015-05-18: 40 mg via ORAL
  Filled 2015-05-18: qty 2

## 2015-05-18 MED ORDER — PREDNISONE 20 MG PO TABS: 40.0000 mg | ORAL_TABLET | Freq: Every day | ORAL | Status: DC

## 2015-05-18 MED ORDER — ALBUTEROL SULFATE HFA 108 (90 BASE) MCG/ACT IN AERS
1.0000 | INHALATION_SPRAY | RESPIRATORY_TRACT | Status: DC | PRN
  Administered 2015-05-18: 1 via RESPIRATORY_TRACT
  Filled 2015-05-18: qty 6.7

## 2015-05-18 MED ORDER — BENZONATATE 100 MG PO CAPS: 100.0000 mg | ORAL_CAPSULE | Freq: Three times a day (TID) | ORAL | Status: DC | PRN

## 2015-05-18 NOTE — ED Notes (Signed)
Patient is resting comfortably. 

## 2015-05-18 NOTE — Discharge Instructions (Signed)

## 2015-05-18 NOTE — ED Provider Notes (Signed)
CSN: [id number]     Arrival date & time 05/17/15  2230 History   By signing my name below, I, Evelene Croon, attest that this documentation has been prepared under the direction and in the presence of Julianne Rice, MD . Electronically Signed: Evelene Croon, Scribe. 05/18/2015. 2:48 AM.  Chief Complaint  Patient presents with  . Cough    The history is provided by the patient. No language interpreter was used.     HPI Comments:  [name] is a 34 y.o. female who presents to the Emergency Department complaining of continued cough, onset ~ 2 weeks ago. Her cough is productive with white sputum. She reports associated HA and SOB secondary to cough, and voice change. Pt was seen in the ED for same on 05/11/15 and was discharged with cough med and abx which she had completed without improvement. She denies congestion, fever, sinus pressure, swelling/pain in BLE, and sore throat. No recent long periods of immobilization. No alleviating factors noted. She also denies h/o asthma.   Past Medical History  Diagnosis Date  . 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  . Typhoid fever     hx of s/pabdominal  surgery for complication.  . Irregular menses     followed by Dr Kalman Shan and [facility] infertility clinic  . Depression   . 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 UTI (lower urinary tract infection)    Past Surgical History  Procedure Laterality Date  . Abdominal surgery      R/T typhoid fever 2002 and revision in 2004   Family History  Problem Relation Age of Onset  . Cancer Maternal Aunt     breast cancer at age 69yrs  . Diabetes Neg Hx    Social History  Substance Use Topics  . Smoking status: Never Smoker   . Smokeless tobacco: Never Used  . Alcohol Use: 0.0 oz/week    0 Standard drinks or equivalent per week     Comment: Occasionally   OB History    Gravida Para Term Preterm AB TAB SAB Ectopic  Multiple Living   2 2 2       2      Review of Systems  Constitutional: Negative for fever.  HENT: Negative for congestion, sinus pressure and sore throat.   Respiratory: Positive for cough and shortness of breath. Negative for wheezing.   Cardiovascular: Negative for leg swelling.  Gastrointestinal: Negative for nausea, vomiting and abdominal pain.  Musculoskeletal: Negative for back pain and neck pain.  Skin: Negative for rash and wound.  Neurological: Positive for headaches. Negative for dizziness, weakness, light-headedness and numbness.  All other systems reviewed and are negative.   Allergies  Ibuprofen  Home Medications   Prior to Admission medications   Medication Sig Start Date End Date Taking? Authorizing Provider  acetaminophen (TYLENOL) 325 MG tablet Take 650 mg by mouth every 6 (six) hours as needed. For pain.   Yes Historical Provider, MD  cabergoline (DOSTINEX) 0.5 MG tablet Take 1 tablet (0.5 mg total) by mouth 2 (two) times a week. 04/15/15  Yes Elayne Snare, MD  HYDROcodone-homatropine [facility]) 5-1.5 MG/5ML syrup Take 5 mLs by mouth every 6 (six) hours as needed for cough. 05/11/15  Yes Leonard Schwartz, MD  Naproxen Sodium (ALEVE) 220 MG CAPS Take 220 mg by mouth every 8 (eight) hours as needed (pain).   Yes Historical Provider, MD  Prenatal Multivit-Min-Fe-FA (  PRENATAL VITAMINS PO) Take by mouth.   Yes Historical Provider, MD  benzonatate (TESSALON) 100 MG capsule Take 1 capsule (100 mg total) by mouth 3 (three) times daily as needed for cough. 05/18/15   Julianne Rice, MD  predniSONE (DELTASONE) 20 MG tablet Take 2 tablets (40 mg total) by mouth daily. 05/18/15   Julianne Rice, MD   BP 107/68 mmHg  Pulse 73  Temp(Src) 98.2 F (36.8 C) (Oral)  Resp 23  Ht 5\' 3"  (1.6 m)  Wt 175 lb (79.379 kg)  BMI 31.01 kg/m2  SpO2 100%  LMP 04/29/2015 Physical Exam  Constitutional: She is oriented to person, place, and time. She appears well-developed and well-nourished.  No distress.  HENT:  Head: Normocephalic and atraumatic.  Mouth/Throat: Oropharynx is clear and moist.  Eyes: EOM are normal. Pupils are equal, round, and reactive to light.  Neck: Normal range of motion. Neck supple.  Cardiovascular: Normal rate and regular rhythm.   Pulmonary/Chest: Effort normal and breath sounds normal. No respiratory distress. She has no wheezes. She has no rales.  Patient with prolonged expiratory phase. No definite wheezing.  Abdominal: Soft. Bowel sounds are normal. She exhibits no distension and no mass. There is no tenderness. There is no rebound and no guarding.  Musculoskeletal: Normal range of motion. She exhibits no edema or tenderness.  No lower extremity swelling or tenderness.  Neurological: She is alert and oriented to person, place, and time.  Patient is alert and oriented x3 with clear, goal oriented speech. Patient has 5/5 motor in all extremities. Sensation is intact to light touch. Bilateral finger-to-nose is normal with no signs of dysmetria. Patient has a normal gait and walks without assistance.  Skin: Skin is warm and dry. No rash noted. No erythema.  Psychiatric: She has a normal mood and affect. Her behavior is normal.  Nursing note and vitals reviewed.   ED Course  Procedures   DIAGNOSTIC STUDIES:  Oxygen Saturation is 96% on RA, normal by my interpretation.    COORDINATION OF CARE:  2:46 AM Pt updated with results. Discussed treatment plan with pt at bedside and pt agreed to plan.  Labs Review Labs Reviewed  COMPREHENSIVE METABOLIC PANEL - Abnormal; Notable for the following:    Sodium 133 (*)    CO2 21 (*)    Total Bilirubin 0.1 (*)    All other components within normal limits  CBC    Imaging Review Dg Chest 2 View  05/17/2015  CLINICAL DATA:  Pneumonia, persistent cough for 6 days EXAM: CHEST  2 VIEW COMPARISON:  05/11/2015 FINDINGS: Cardiomediastinal silhouette is stable. No acute infiltrate or pleural effusion. No  pulmonary edema pre improvement in aeration in left perihilar region. IMPRESSION: No acute infiltrate or pulmonary edema. Improvement in aeration in left perihilar region. Electronically Signed   By: Lahoma Crocker M.D.   On: 05/17/2015 23:57   I have personally reviewed and evaluated these images and lab results as part of my medical decision-making.   EKG Interpretation None      MDM   Final diagnoses:  Cough    I personally performed the services described in this documentation, which was scribed in my presence. The recorded information has been reviewed and is accurate.   Chest x-ray without any acute findings. Concerned that the patient's persistent cough may be due to bronchospasm. Get short course of steroids and inhaler. Return precautions given.   Julianne Rice, MD 05/19/15 [phone]

## 2015-06-03 ENCOUNTER — Ambulatory Visit (INDEPENDENT_AMBULATORY_CARE_PROVIDER_SITE_OTHER): Payer: 59 | Admitting: *Deleted

## 2015-06-03 ENCOUNTER — Encounter: Payer: Self-pay | Admitting: *Deleted

## 2015-06-03 DIAGNOSIS — Z3201 Encounter for pregnancy test, result positive: Secondary | ICD-10-CM

## 2015-06-03 DIAGNOSIS — Z3200 Encounter for pregnancy test, result unknown: Secondary | ICD-10-CM

## 2015-06-03 LAB — POCT PREGNANCY, URINE: Preg Test, Ur: POSITIVE — AB

## 2015-06-03 NOTE — Progress Notes (Signed)
Here for pregnancy test- which was positive.  Used interpreter. States has regular periods. LMP 04/29/15 which makes her [redacted] weeks pregnant. At first states will go to [facility], then decided she would go to our clinic and will make an appointment today.  She also asks if it is ok to take a medicine she was given by a doctor called Cabergoline.  Discussed with Dr. Elly Modena and advised patient to stop taking that medicine.   She states she is now only taking prenatal vitamins and tylenol. Advised her not to take any other medicines unless she checks with [place] first or tells doctor prescribing a medicine that she is pregnant. She voices understanding.

## 2015-06-14 ENCOUNTER — Inpatient Hospital Stay (HOSPITAL_COMMUNITY)
Admission: AD | Admit: 2015-06-14 | Discharge: 2015-06-15 | Disposition: A | Discharge status: Home / Self Care | Payer: Medicaid Other | Source: Ambulatory Visit | Attending: Family Medicine | Admitting: Family Medicine

## 2015-06-14 ENCOUNTER — Encounter (HOSPITAL_COMMUNITY): Payer: Self-pay

## 2015-06-14 ENCOUNTER — Inpatient Hospital Stay (HOSPITAL_COMMUNITY): Payer: Medicaid Other

## 2015-06-14 DIAGNOSIS — O209 Hemorrhage in early pregnancy, unspecified: Secondary | ICD-10-CM | POA: Diagnosis not present

## 2015-06-14 DIAGNOSIS — D352 Benign neoplasm of pituitary gland: Secondary | ICD-10-CM | POA: Insufficient documentation

## 2015-06-14 DIAGNOSIS — N939 Abnormal uterine and vaginal bleeding, unspecified: Secondary | ICD-10-CM | POA: Diagnosis present

## 2015-06-14 DIAGNOSIS — O3680X0 Pregnancy with inconclusive fetal viability, not applicable or unspecified: Secondary | ICD-10-CM

## 2015-06-14 DIAGNOSIS — O4691 Antepartum hemorrhage, unspecified, first trimester: Secondary | ICD-10-CM | POA: Diagnosis not present

## 2015-06-14 DIAGNOSIS — Z3A01 Less than 8 weeks gestation of pregnancy: Secondary | ICD-10-CM | POA: Diagnosis not present

## 2015-06-14 HISTORY — DX: Hyperfunction of pituitary gland, unspecified: E22.9

## 2015-06-14 HISTORY — DX: Benign neoplasm of pituitary gland: D35.2

## 2015-06-14 LAB — CBC
HEMATOCRIT: 33.6 % — AB (ref 36.0–46.0)
HEMOGLOBIN: 11.1 g/dL — AB (ref 12.0–15.0)
MCH: 28.2 pg (ref 26.0–34.0)
MCHC: 33 g/dL (ref 30.0–36.0)
MCV: 85.5 fL (ref 78.0–100.0)
Platelets: 317 10*3/uL (ref 150–400)
RBC: 3.93 MIL/uL (ref 3.87–5.11)
RDW: 12.9 % (ref 11.5–15.5)
WBC: 7 10*3/uL (ref 4.0–10.5)

## 2015-06-14 LAB — URINALYSIS, ROUTINE W REFLEX MICROSCOPIC
Bilirubin Urine: NEGATIVE
GLUCOSE, UA: NEGATIVE mg/dL
KETONES UR: NEGATIVE mg/dL
LEUKOCYTES UA: NEGATIVE
NITRITE: NEGATIVE
PH: 6 (ref 5.0–8.0)
Protein, ur: NEGATIVE mg/dL
SPECIFIC GRAVITY, URINE: 1.01 (ref 1.005–1.030)

## 2015-06-14 LAB — URINE MICROSCOPIC-ADD ON

## 2015-06-14 NOTE — MAU Note (Signed)
Reddish bleeding like starting a period- bleeding began at 9 pm.  Had a positive UPT downstairs on 06/03/15.  Little cramping on left lower abd started x 2 weeks comes and goes.

## 2015-06-15 DIAGNOSIS — O4691 Antepartum hemorrhage, unspecified, first trimester: Secondary | ICD-10-CM | POA: Diagnosis not present

## 2015-06-15 LAB — HIV ANTIBODY (ROUTINE TESTING W REFLEX): HIV Screen 4th Generation wRfx: NONREACTIVE

## 2015-06-15 LAB — HCG, QUANTITATIVE, PREGNANCY: HCG, BETA CHAIN, QUANT, S: 3370 m[IU]/mL — AB (ref <5)

## 2015-06-15 LAB — WET PREP, GENITAL
CLUE CELLS WET PREP: NONE SEEN
Sperm: NONE SEEN
Trich, Wet Prep: NONE SEEN
YEAST WET PREP: NONE SEEN

## 2015-06-15 NOTE — Progress Notes (Signed)
Fatima Blank CNM in to discuss test results and d/c plan. Written and verbal d/c instructions given and understanding voiced. Will return Sunday pm for repeat BHCG

## 2015-06-15 NOTE — Discharge Instructions (Signed)

## 2015-06-15 NOTE — MAU Provider Note (Signed)
Chief Complaint: Vaginal Bleeding   None     SUBJECTIVE HPI: [name] is a 34 y.o. G3P2002 at [redacted]w[redacted]d by LMP who presents to maternity admissions reporting onset of light bleeding, described as dark red when wiping, tonight before coming to MAU.  She denies associated symptoms including pain.  She has not tried any treatments for the bleeding. Duration of the bleeding is 2-3 hours. She was started on a medication for pituitary tumor earlier this year and stopped this medication when she had a positive pregnancy test.  She has a New OB appt scheduled in [place] on 07/10/15.   She denies vaginal discharge/itching/burning, urinary symptoms, h/a, dizziness, n/v, or fever/chills.     HPI  Past Medical History  Diagnosis Date  . 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  . Typhoid fever     hx of s/pabdominal  surgery for complication.  . Irregular menses     followed by Dr Kalman Shan and [facility] infertility clinic  . Depression   . 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 UTI (lower urinary tract infection)   . Pituitary microadenoma with hyperprolactinemia [facility])    Past Surgical History  Procedure Laterality Date  . Abdominal surgery      R/T typhoid fever 2002 and revision in 2004   Social History   Social History  . Marital Status: Married    Spouse Name: N/A  . Number of Children: N/A  . Years of Education: N/A   Occupational History  . Not on file.   Social History Main Topics  . Smoking status: Never Smoker   . Smokeless tobacco: Never Used  . Alcohol Use: 0.0 oz/week    0 Standard drinks or equivalent per week     Comment: Occasionally not with pregnancy  . Drug Use: No  . Sexual Activity:    Partners: Male    Birth Control/ Protection: None     Comment: Occasional condoms   Other Topics Concern  . Not on file   Social History Narrative   Financial assistance approved for 100% discount at [facility]  and has [facility] card, Bonna Gains Aug 02, 2009.   Does hair braiding. Lives in [place].   No current facility-administered medications on file prior to encounter.   Current Outpatient Prescriptions on File Prior to Encounter  Medication Sig Dispense Refill  . acetaminophen (TYLENOL) 325 MG tablet Take 650 mg by mouth every 6 (six) hours as needed. For pain.    . Naproxen Sodium (ALEVE) 220 MG CAPS Take 220 mg by mouth every 8 (eight) hours as needed (pain).    . Prenatal Multivit-Min-Fe-FA (PRENATAL VITAMINS PO) Take by mouth.    . benzonatate (TESSALON) 100 MG capsule Take 1 capsule (100 mg total) by mouth 3 (three) times daily as needed for cough. 21 capsule 0  . cabergoline (DOSTINEX) 0.5 MG tablet Take 1 tablet (0.5 mg total) by mouth 2 (two) times a week. 8 tablet 3  . HYDROcodone-homatropine (HYCODAN) 5-1.5 MG/5ML syrup Take 5 mLs by mouth every 6 (six) hours as needed for cough. 120 mL 0  . predniSONE (DELTASONE) 20 MG tablet Take 2 tablets (40 mg total) by mouth daily. 8 tablet 0   Allergies  Allergen Reactions  . Ibuprofen Other (See Comments)    Chest pain    ROS:  Review of Systems  Constitutional: Negative for fever, chills and fatigue.  Respiratory: Negative  for shortness of breath.   Cardiovascular: Negative for chest pain.  Genitourinary: Positive for vaginal bleeding. Negative for dysuria, flank pain, vaginal discharge, difficulty urinating, vaginal pain and pelvic pain.  Neurological: Negative for dizziness and headaches.  Psychiatric/Behavioral: Negative.      I have reviewed patient's Past Medical Hx, Surgical Hx, Family Hx, Social Hx, medications and allergies.   Physical Exam   Patient Vitals for the past 24 hrs:  BP Temp Temp src Pulse Resp SpO2 Height  06/15/15 0008 116/61 mmHg 97.7 F (36.5 C) - 84 18 - -  06/14/15 2206 149/74 mmHg 98.7 F (37.1 C) Oral 74 16 97 % 5\' 3"  (1.6 m)   Constitutional: Well-developed, well-nourished female in no acute  distress.  Cardiovascular: normal rate Respiratory: normal effort GI: Abd soft, non-tender. Pos BS x 4 MS: Extremities nontender, no edema, normal ROM Neurologic: Alert and oriented x 4.  GU: Neg CVAT.  PELVIC EXAM: Cervix pink, visually closed, without lesion, scant dark red/brown bleeding, vaginal walls and external genitalia normal Bimanual exam: Cervix 0/long/high, firm, anterior, neg CMT, uterus nontender, nonenlarged, adnexa without tenderness, enlargement, or mass   LAB RESULTS Results for orders placed or performed during the hospital encounter of 06/14/15 (from the past 24 hour(s))  Urinalysis, Routine w reflex microscopic (not at [facility])     Status: Abnormal   Collection Time: 06/14/15  9:44 PM  Result Value Ref Range   Color, Urine YELLOW YELLOW   APPearance CLEAR CLEAR   Specific Gravity, Urine 1.010 1.005 - 1.030   pH 6.0 5.0 - 8.0   Glucose, UA NEGATIVE NEGATIVE mg/dL   Hgb urine dipstick SMALL (A) NEGATIVE   Bilirubin Urine NEGATIVE NEGATIVE   Ketones, ur NEGATIVE NEGATIVE mg/dL   Protein, ur NEGATIVE NEGATIVE mg/dL   Nitrite NEGATIVE NEGATIVE   Leukocytes, UA NEGATIVE NEGATIVE  Urine microscopic-add on     Status: Abnormal   Collection Time: 06/14/15  9:44 PM  Result Value Ref Range   Squamous Epithelial / LPF 0-5 (A) NONE SEEN   WBC, UA 0-5 0 - 5 WBC/hpf   RBC / HPF 0-5 0 - 5 RBC/hpf   Bacteria, UA FEW (A) NONE SEEN  CBC     Status: Abnormal   Collection Time: 06/14/15 11:00 PM  Result Value Ref Range   WBC 7.0 4.0 - 10.5 K/uL   RBC 3.93 3.87 - 5.11 MIL/uL   Hemoglobin 11.1 (L) 12.0 - 15.0 g/dL   HCT 33.6 (L) 36.0 - 46.0 %   MCV 85.5 78.0 - 100.0 fL   MCH 28.2 26.0 - 34.0 pg   MCHC 33.0 30.0 - 36.0 g/dL   RDW 12.9 11.5 - 15.5 %   Platelets 317 150 - 400 K/uL  hCG, quantitative, pregnancy     Status: Abnormal   Collection Time: 06/14/15 11:00 PM  Result Value Ref Range   hCG, Beta Chain, Quant, S 3370 (H) <5 mIU/mL       IMAGING Dg Chest 2  View  05/17/2015  CLINICAL DATA:  Pneumonia, persistent cough for 6 days EXAM: CHEST  2 VIEW COMPARISON:  05/11/2015 FINDINGS: Cardiomediastinal silhouette is stable. No acute infiltrate or pleural effusion. No pulmonary edema pre improvement in aeration in left perihilar region. IMPRESSION: No acute infiltrate or pulmonary edema. Improvement in aeration in left perihilar region. Electronically Signed   By: Lahoma Crocker M.D.   On: 05/17/2015 23:57   US Ob Comp Less 14 Wks  06/14/2015  CLINICAL DATA:  First trimester pregnancy with vaginal bleeding. LMP 04/29/2015. EXAM: OBSTETRIC <14 WK [place] AND TRANSVAGINAL OB US TECHNIQUE: Both transabdominal and transvaginal ultrasound examinations were performed for complete evaluation of the gestation as well as the maternal uterus, adnexal regions, and pelvic cul-de-sac. Transvaginal technique was performed to assess early pregnancy. COMPARISON:  Pelvic ultrasound 10/24/2014 FINDINGS: Intrauterine gestational sac: Visualized/normal in shape. Yolk sac:  Not visualized. Embryo:  Not visualized. MSD: 6.4  mm   5 w   2  d                  [place] EDC: 02/12/2016 Subchorionic hemorrhage:  Small Maternal uterus/adnexae: Both maternal ovaries are visualized and appear normal with a probable small corpus luteal cyst on the left. No evidence of adnexal mass. The myometrium is diffusely heterogeneous with 2 focal hypoechoic areas measuring 2.3 x 2.1 x 2.3 cm at the right fundus and 1.3 x 1.3 x 1.5 cm in the left anterior uterus. Appearance is suspicious for adenomyosis. Trace free pelvic fluid is noted. IMPRESSION: 1. Probable early intrauterine gestational sac, but no yolk sac, fetal pole, or cardiac activity yet visualized. Recommend follow-up quantitative B-HCG levels and follow-up US in 14 days to confirm and assess viability. This recommendation follows SRU consensus guidelines: Diagnostic Criteria for Nonviable Pregnancy Early in the First Trimester. [place] Med [id number].  2. Small subchorionic hematoma. 3. Myometrial heterogeneity suspicious for adenomyosis. Electronically Signed   By: Richardean Sale M.D.   On: 06/14/2015 23:50   US Ob Transvaginal  06/14/2015  CLINICAL DATA:  First trimester pregnancy with vaginal bleeding. LMP 04/29/2015. EXAM: OBSTETRIC <14 WK [place] AND TRANSVAGINAL OB US TECHNIQUE: Both transabdominal and transvaginal ultrasound examinations were performed for complete evaluation of the gestation as well as the maternal uterus, adnexal regions, and pelvic cul-de-sac. Transvaginal technique was performed to assess early pregnancy. COMPARISON:  Pelvic ultrasound 10/24/2014 FINDINGS: Intrauterine gestational sac: Visualized/normal in shape. Yolk sac:  Not visualized. Embryo:  Not visualized. MSD: 6.4  mm   5 w   2  d                  [place] EDC: 02/12/2016 Subchorionic hemorrhage:  Small Maternal uterus/adnexae: Both maternal ovaries are visualized and appear normal with a probable small corpus luteal cyst on the left. No evidence of adnexal mass. The myometrium is diffusely heterogeneous with 2 focal hypoechoic areas measuring 2.3 x 2.1 x 2.3 cm at the right fundus and 1.3 x 1.3 x 1.5 cm in the left anterior uterus. Appearance is suspicious for adenomyosis. Trace free pelvic fluid is noted. IMPRESSION: 1. Probable early intrauterine gestational sac, but no yolk sac, fetal pole, or cardiac activity yet visualized. Recommend follow-up quantitative B-HCG levels and follow-up US in 14 days to confirm and assess viability. This recommendation follows SRU consensus guidelines: Diagnostic Criteria for Nonviable Pregnancy Early in the First Trimester. [place] [employer] Med [id number]. 2. Small subchorionic hematoma. 3. Myometrial heterogeneity suspicious for adenomyosis. Electronically Signed   By: Richardean Sale M.D.   On: 06/14/2015 23:50    MAU Management/MDM: Ordered labs and reviewed results.  Findings today could represent a normal early pregnancy, spontaneous  abortion or ectopic pregnancy which can be life-threatening.  Ectopic precautions were given to the patient with plan to return in 48 hours for repeat quant hcg to evaluate pregnancy development.  Pt stable at time of discharge.  ASSESSMENT 1. Pregnancy of unknown anatomic location   2. Vaginal bleeding  in pregnancy, first trimester     PLAN Discharge home with ectopic precautions Return to MAU on 12/25 after 11 pm for repeat labs as office is closed Monday Return sooner as needed for emergencies    Medication List    STOP taking these medications        ALEVE 220 MG Caps  Generic drug:  Naproxen Sodium     benzonatate 100 MG capsule  Commonly known as:  TESSALON     cabergoline 0.5 MG tablet  Commonly known as:  DOSTINEX     HYDROcodone-homatropine 5-1.5 MG/5ML syrup  Commonly known as:  HYCODAN     predniSONE 20 MG tablet  Commonly known as:  DELTASONE      TAKE these medications        acetaminophen 325 MG tablet  Commonly known as:  TYLENOL  Take 650 mg by mouth every 6 (six) hours as needed. For pain.     PRENATAL VITAMINS PO  Take by mouth.       Follow-up Information    Follow up with [facility].   Why:  In 48 hours for repeat labs or sooner as needed   Contact information:   [address] [id number] [place] Certified Nurse-Midwife 06/15/2015  12:31 AM    t

## 2015-06-16 ENCOUNTER — Inpatient Hospital Stay (HOSPITAL_COMMUNITY)
Admission: AD | Admit: 2015-06-16 | Discharge: 2015-06-16 | Disposition: A | Discharge status: Home / Self Care | Payer: Medicaid Other | Source: Ambulatory Visit | Attending: Family Medicine | Admitting: Family Medicine

## 2015-06-16 DIAGNOSIS — O2691 Pregnancy related conditions, unspecified, first trimester: Secondary | ICD-10-CM | POA: Diagnosis not present

## 2015-06-16 DIAGNOSIS — O4691 Antepartum hemorrhage, unspecified, first trimester: Secondary | ICD-10-CM | POA: Diagnosis not present

## 2015-06-16 DIAGNOSIS — N831 Corpus luteum cyst of ovary, unspecified side: Secondary | ICD-10-CM | POA: Diagnosis not present

## 2015-06-16 DIAGNOSIS — Z3A01 Less than 8 weeks gestation of pregnancy: Secondary | ICD-10-CM | POA: Diagnosis not present

## 2015-06-16 DIAGNOSIS — O209 Hemorrhage in early pregnancy, unspecified: Secondary | ICD-10-CM | POA: Insufficient documentation

## 2015-06-16 LAB — HCG, QUANTITATIVE, PREGNANCY: hCG, Beta Chain, Quant, S: 2912 m[IU]/mL — ABNORMAL HIGH (ref <5)

## 2015-06-16 NOTE — MAU Provider Note (Signed)
[name] is a 34 y.o. [id number] @ [redacted]w[redacted]d gestation who returns to MAU for follow up Bhcg. She was here 2 days ago for bleeding in early pregnancy. She reports that today bleeding has increased she has had cramping off and on like period pain.   BP 121/66 mmHg  Pulse 89  Temp(Src) 97.9 F (36.6 C) (Oral)  Resp 16  LMP 04/29/2015   Abdomen soft, non tender with deep palpation, scaring due to previous surgeries. Unable to reproduce the cramping pain that comes and goes.   Pelvic exam: External genitalia without lesions, moderate blood vaginal vault, cervix closed, long, no CMT, no adnexal tenderness, uterus slightly enlarged.   US Ob Comp Less 14 Wks  06/14/2015  CLINICAL DATA:  First trimester pregnancy with vaginal bleeding. LMP 04/29/2015. EXAM: OBSTETRIC <14 WK [place] AND TRANSVAGINAL OB US TECHNIQUE: Both transabdominal and transvaginal ultrasound examinations were performed for complete evaluation of the gestation as well as the maternal uterus, adnexal regions, and pelvic cul-de-sac. Transvaginal technique was performed to assess early pregnancy. COMPARISON:  Pelvic ultrasound 10/24/2014 FINDINGS: Intrauterine gestational sac: Visualized/normal in shape. Yolk sac:  Not visualized. Embryo:  Not visualized. MSD: 6.4  mm   5 w   2  d                  [place] EDC: 02/12/2016 Subchorionic hemorrhage:  Small Maternal uterus/adnexae: Both maternal ovaries are visualized and appear normal with a probable small corpus luteal cyst on the left. No evidence of adnexal mass. The myometrium is diffusely heterogeneous with 2 focal hypoechoic areas measuring 2.3 x 2.1 x 2.3 cm at the right fundus and 1.3 x 1.3 x 1.5 cm in the left anterior uterus. Appearance is suspicious for adenomyosis. Trace free pelvic fluid is noted. IMPRESSION: 1. Probable early intrauterine gestational sac, but no yolk sac, fetal pole, or cardiac activity yet visualized. Recommend follow-up quantitative B-HCG levels and follow-up US in 14 days  to confirm and assess viability. This recommendation follows SRU consensus guidelines: Diagnostic Criteria for Nonviable Pregnancy Early in the First Trimester. [place] [employer] Med [id number]. 2. Small subchorionic hematoma. 3. Myometrial heterogeneity suspicious for adenomyosis. Electronically Signed   By: Richardean Sale M.D.   On: 06/14/2015 23:50   US Ob Transvaginal  06/14/2015  CLINICAL DATA:  First trimester pregnancy with vaginal bleeding. LMP 04/29/2015. EXAM: OBSTETRIC <14 WK [place] AND TRANSVAGINAL OB US TECHNIQUE: Both transabdominal and transvaginal ultrasound examinations were performed for complete evaluation of the gestation as well as the maternal uterus, adnexal regions, and pelvic cul-de-sac. Transvaginal technique was performed to assess early pregnancy. COMPARISON:  Pelvic ultrasound 10/24/2014 FINDINGS: Intrauterine gestational sac: Visualized/normal in shape. Yolk sac:  Not visualized. Embryo:  Not visualized. MSD: 6.4  mm   5 w   2  d                  [place] EDC: 02/12/2016 Subchorionic hemorrhage:  Small Maternal uterus/adnexae: Both maternal ovaries are visualized and appear normal with a probable small corpus luteal cyst on the left. No evidence of adnexal mass. The myometrium is diffusely heterogeneous with 2 focal hypoechoic areas measuring 2.3 x 2.1 x 2.3 cm at the right fundus and 1.3 x 1.3 x 1.5 cm in the left anterior uterus. Appearance is suspicious for adenomyosis. Trace free pelvic fluid is noted. IMPRESSION: 1. Probable early intrauterine gestational sac, but no yolk sac, fetal pole, or cardiac activity yet visualized. Recommend follow-up quantitative B-HCG levels and  follow-up US in 14 days to confirm and assess viability. This recommendation follows SRU consensus guidelines: Diagnostic Criteria for Nonviable Pregnancy Early in the First Trimester. [place] [employer] Med [id number]. 2. Small subchorionic hematoma. 3. Myometrial heterogeneity suspicious for adenomyosis. Electronically  Signed   By: Richardean Sale M.D.   On: 06/14/2015 23:50   Results for orders placed or performed during the hospital encounter of 06/16/15 (from the past 24 hour(s))  hCG, quantitative, pregnancy     Status: Abnormal   Collection Time: 06/16/15  2:10 PM  Result Value Ref Range   hCG, Beta Chain, Quant, S 2912 (H) <5 mIU/mL   The Bhcg has dropped slightly today from 3370 to 2912. Patient to follow up in the [facility] in 2 days.  Discussed with the patient and all questioned fully answered. She will return if any problems arise. Ectopic precautions.

## 2015-06-16 NOTE — MAU Note (Signed)
Here for follow up.  States is feeling a little better.  A little bit of pain on LLQ.  Bleeding continues, has changed twice today.

## 2015-06-16 NOTE — Discharge Instructions (Signed)
Your pregnancy hormone level dropped slightly today.  Return to the [facility] on Tuesday between 1 pm and 3 pm to have your pregnancy hormone level rechecked and to discuss results. Return sooner for heavy bleeding, soaking more than a pad an hour or for severe pain, fever, feeling weak and dizzy or other problems.  Vaginal Bleeding During Pregnancy, First Trimester A small amount of bleeding (spotting) from the vagina is common in early pregnancy. Sometimes the bleeding is normal and is not a problem, and sometimes it is a sign of something serious. Be sure to tell your doctor about any bleeding from your vagina right away. HOME CARE  Watch your condition for any changes.  Follow your doctor's instructions about how active you can be.  If you are on bed rest:  You may need to stay in bed and only get up to use the bathroom.  You may be allowed to do some activities.  If you need help, make plans for someone to help you.  Write down:  The number of pads you use each day.  How often you change pads.  How soaked (saturated) your pads are.  Do not use tampons.  Do not douche.  Do not have sex or orgasms until your doctor says it is okay.  If you pass any tissue from your vagina, save the tissue so you can show it to your doctor.  Only take medicines as told by your doctor.  Do not take aspirin because it can make you bleed.  Keep all follow-up visits as told by your doctor. GET HELP IF:   You bleed from your vagina.  You have cramps.  You have labor pains.  You have a fever that does not go away after you take medicine. GET HELP RIGHT AWAY IF:   You have very bad cramps in your back or belly (abdomen).  You pass large clots or tissue from your vagina.  You bleed more.  You feel light-headed or weak.  You pass out (faint).  You have chills.  You are leaking fluid or have a gush of fluid from your vagina.  You pass out while pooping (having a  bowel movement). MAKE SURE YOU:  Understand these instructions.  Will watch your condition.  Will get help right away if you are not doing well or get worse.   This information is not intended to replace advice given to you by your health care provider. Make sure you discuss any questions you have with your health care provider.   Document Released: 10/23/2013 Document Reviewed: 10/23/2013 Elsevier Interactive Patient Education [employer].

## 2015-06-18 ENCOUNTER — Other Ambulatory Visit: Payer: Medicaid Other

## 2015-06-18 DIAGNOSIS — O200 Threatened abortion: Secondary | ICD-10-CM

## 2015-06-18 LAB — HCG, QUANTITATIVE, PREGNANCY: hCG, Beta Chain, Quant, S: 551 m[IU]/mL — ABNORMAL HIGH (ref <5)

## 2015-06-18 NOTE — Progress Notes (Unsigned)
Patient here today for bhcg 48 hour f/u following MAU visit on 12/25. Patient reports increased bleeding like a period with clots since her visit. Patient also reports pain/cramping.

## 2015-06-21 LAB — GC/CHLAMYDIA PROBE AMP (~~LOC~~) NOT AT ARMC
CHLAMYDIA, DNA PROBE: NEGATIVE
NEISSERIA GONORRHEA: NEGATIVE

## 2015-06-25 ENCOUNTER — Other Ambulatory Visit: Payer: Medicaid Other

## 2015-06-25 DIAGNOSIS — O200 Threatened abortion: Secondary | ICD-10-CM

## 2015-06-26 ENCOUNTER — Other Ambulatory Visit: Payer: Self-pay

## 2015-06-26 ENCOUNTER — Telehealth: Payer: Self-pay

## 2015-06-26 LAB — HCG, QUANTITATIVE, PREGNANCY: HCG, BETA CHAIN, QUANT, S: 22.6 m[IU]/mL — AB

## 2015-06-26 MED ORDER — PRENATAL VITAMINS PLUS 27-1 MG PO TABS: 1.0000 | ORAL_TABLET | Freq: Once | ORAL | Status: DC

## 2015-06-26 NOTE — Telephone Encounter (Signed)
Pt called wanting a refill on her prenatal vitamins. They have been called in to [facility] on [address].

## 2015-07-10 ENCOUNTER — Other Ambulatory Visit: Payer: Medicaid Other

## 2015-07-10 ENCOUNTER — Encounter: Payer: 59 | Admitting: Family

## 2015-07-10 DIAGNOSIS — D352 Benign neoplasm of pituitary gland: Secondary | ICD-10-CM

## 2015-07-11 LAB — PROLACTIN: Prolactin: 74.2 ng/mL — ABNORMAL HIGH (ref 4.8–23.3)

## 2015-07-15 ENCOUNTER — Ambulatory Visit (INDEPENDENT_AMBULATORY_CARE_PROVIDER_SITE_OTHER): Payer: Medicaid Other | Admitting: Endocrinology

## 2015-07-15 ENCOUNTER — Encounter: Payer: Self-pay | Admitting: Endocrinology

## 2015-07-15 VITALS — BP 110/68 | HR 87 | Temp 98.5°F | Resp 14 | Ht 61.0 in | Wt 177.6 lb

## 2015-07-15 DIAGNOSIS — D352 Benign neoplasm of pituitary gland: Secondary | ICD-10-CM | POA: Diagnosis not present

## 2015-07-15 MED ORDER — BROMOCRIPTINE MESYLATE 2.5 MG PO TABS: 2.5000 mg | ORAL_TABLET | Freq: Every day | ORAL | Status: DC

## 2015-07-15 NOTE — Progress Notes (Signed)
Patient ID: [name], female   DOB: [birth year], 35 y.o.   MRN: [id number]   Chief complaint: Follow-up of hyperprolactinemia  History of Present Illness:  Background history: She had been trying to conceive for approximately 3 years without any success.  Her periods were normal for two to three years after she had her daughter about 4 years ago.  She thinks she had normal regular monthly cycles last year until December. She has not had a cycle since June 05, 2014 Also she has noticed that her milk discharge from her breasts has continued since her last pregnancy and is about the same; she will get some milk discharge when she is lying on her side or when she is bathing.    Because of her lack of menstrual cycles and difficulty conceiving she saw her gynecologist who found her to have a fasting prolactin level of 167 in 4/16.    Initially had a 9 mm pituitary microadenoma on her MRI done in 10/2014  Recent history: She was started on Dostinex 0.5 mg in 7/16 which she was taking on Mondays and Thursdays This was increased to 0.5 mg on Monday and 0.25 mg on Thursdays on her visit in 8/16 since she was still having relatively high prolactin level and mild galactorrhea  She now has not taken the medication since about late November when she found out she was pregnant This is despite her being instructed on avoiding pregnancy until her prolactinoma has been in remission She does not like to use contraceptives  Currently the status of her pregnancy is unclear and appears that she may not be sustaining her pregnancy with her hCG level being significantly lower this month She is still wanting to have a pregnancy regardless of her prolactinoma   Appointment on 07/10/2015  Component Date Value Ref Range Status  . Prolactin 07/10/2015 74.2* 4.8 - 23.3 ng/mL Final       Past Medical History  Diagnosis Date  . Female infertility     histosalpingogram showed left fallopian  tube occlusion, right partially open.  . Typhoid fever     hx of s/pabdominal  surgery for complication.  . Irregular menses     followed by Dr Kalman Shan and [facility] infertility clinic  . Depression   . 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 UTI (lower urinary tract infection)   . Pituitary microadenoma with hyperprolactinemia [facility])     Past Surgical History  Procedure Laterality Date  . Abdominal surgery      R/T typhoid fever 2002 and revision in 2004    Family History  Problem Relation Age of Onset  . Cancer Maternal Aunt     breast cancer at age 64yrs  . Diabetes Neg Hx     Social History:  reports that she has never smoked. She has never used smokeless tobacco. She reports that she drinks alcohol. She reports that she does not use illicit drugs.  Allergies:  Allergies  Allergen Reactions  . Ibuprofen Other (See Comments)    Chest pain      Medication List       This list is accurate as of: 07/15/15 11:54 AM.  Always use your most recent med list.               acetaminophen 325 MG tablet  Commonly known as:  TYLENOL  Take 650 mg by mouth every 6 (six) hours as needed. Reported on 07/15/2015  bromocriptine 2.5 MG tablet  Commonly known as:  PARLODEL  Take 1 tablet (2.5 mg total) by mouth daily.     PRENATAL VITAMINS PLUS 27-1 MG Tabs  Take 1 tablet by mouth once.     PRENATAL VITAMINS PO  Take by mouth. Reported on 07/15/2015        LABS:  Appointment on 07/10/2015  Component Date Value Ref Range Status  . Prolactin 07/10/2015 74.2* 4.8 - 23.3 ng/mL Final     REVIEW OF SYSTEMS:          Neurological: she was having complaints of headaches for the last 2-3 years, mild now    PHYSICAL EXAM:  BP 110/68 mmHg  Pulse 87  Temp(Src) 98.5 F (36.9 C)  Resp 14  Ht 5\' 1"  (1.549 m)  Wt 177 lb 9.6 oz (80.559 kg)  BMI 33.57 kg/m2  SpO2 97%  LMP 04/29/2015  Exam not indicated   ASSESSMENT:     PROLACTINOMA causing amenorrhea and  mild galactorrhea. She has a significantly high baseline prolactin level of 167  The patient wants to continue trying to have a pregnancy, appears that she has not been successful with the current pregnancy but needs to be confirmed by the obstetrician Again recommended that she hold off on pregnancy until the prolactinoma is in remission because of her requiring relatively large doses and the likelihood of the prolactinoma enlarging during pregnancy She is still very reluctant to do this She wants to take a medication that is safe to take during pregnancy and discussed that bromocriptine may be relatively safer than Dostinex although no medication can be deemed completely safe Also discussed that prolactin levels are inaccurate during pregnancy  Recurrent cough: Advised her to establish with a PCP and reassured her that it has nothing to do with her prolactinoma or treatment for this  PLAN:   Trial of bromocriptine starting with 1.25 and increasing to 2.5 mg daily with food Discussed possible side effects especially nausea She will come back in about a month If she is not pregnant at that time we will titrate the dose based on prolactin level She will call to let us know the results of her visit with the obstetrician next month  [facility] 07/15/2015, 11:54 AM

## 2015-07-15 NOTE — Patient Instructions (Addendum)
Call to discuss Rx after visit with Gyn in 2/17  Bromocriptine: Take with food, start 1/2 tab daily then after 3 days use 1 tab

## 2015-07-29 ENCOUNTER — Other Ambulatory Visit: Payer: Medicaid Other

## 2015-07-29 ENCOUNTER — Encounter: Payer: Medicaid Other | Admitting: Family Medicine

## 2015-07-29 DIAGNOSIS — O039 Complete or unspecified spontaneous abortion without complication: Secondary | ICD-10-CM

## 2015-07-29 LAB — POCT URINALYSIS DIP (DEVICE)
Bilirubin Urine: NEGATIVE
GLUCOSE, UA: NEGATIVE mg/dL
Hgb urine dipstick: NEGATIVE
KETONES UR: NEGATIVE mg/dL
Leukocytes, UA: NEGATIVE
Nitrite: NEGATIVE
PROTEIN: NEGATIVE mg/dL
SPECIFIC GRAVITY, URINE: 1.02 (ref 1.005–1.030)
Urobilinogen, UA: 0.2 mg/dL (ref 0.0–1.0)
pH: 6.5 (ref 5.0–8.0)

## 2015-07-30 ENCOUNTER — Telehealth: Payer: Self-pay | Admitting: *Deleted

## 2015-07-30 LAB — HCG, QUANTITATIVE, PREGNANCY

## 2015-07-30 NOTE — Telephone Encounter (Signed)
Called pt w/[employer] # [phone] and informed pt that the lab result from yesterday shows no pregnancy hormone. Her miscarriage is complete and she does not require any further testing. She may resume attempts to become pregnant. Pt asked if she needs to take any medication. I advised that she take prenatal vitamins or at least folic acid 1 mg daily. Pt voiced understanding of all information and instructions given.

## 2015-07-30 NOTE — Progress Notes (Signed)
This encounter was created in error - please disregard.

## 2015-08-07 ENCOUNTER — Other Ambulatory Visit: Payer: Medicaid Other

## 2015-08-12 ENCOUNTER — Encounter: Payer: Self-pay | Admitting: Endocrinology

## 2015-08-12 ENCOUNTER — Ambulatory Visit (INDEPENDENT_AMBULATORY_CARE_PROVIDER_SITE_OTHER): Payer: Medicaid Other | Admitting: Endocrinology

## 2015-08-12 DIAGNOSIS — D352 Benign neoplasm of pituitary gland: Secondary | ICD-10-CM | POA: Diagnosis not present

## 2015-08-12 NOTE — Progress Notes (Signed)
Patient ID: [name], female   DOB: [birth year], 35 y.o.   MRN: [id number]   Chief complaint: Follow-up of hyperprolactinemia  History of Present Illness:  Background history: She had been trying to conceive for approximately 3 years without any success.  Her periods were normal for two to three years after she had her daughter about 4 years ago.  She thinks she had normal regular monthly cycles last year until December. She has not had a cycle since June 05, 2014 Also she has noticed that her milk discharge from her breasts has continued since her last pregnancy and is about the same; she will get some milk discharge when she is lying on her side or when she is bathing.    Because of her lack of menstrual cycles and difficulty conceiving she saw her gynecologist who found her to have a fasting prolactin level of 167 in 4/16.    Initially had a 9 mm pituitary microadenoma on her MRI done in 10/2014  Recent history: She was started on Dostinex 0.5 mg in 7/16 which she was taking on Mondays and Thursdays This was increased to 0.5 mg on Monday and 0.25 mg on Thursdays on her visit in 8/16 since she was still having relatively high prolactin level and mild galactorrhea  She  Had a miscarriage in 1/ 17 and at that time because of her high prolactin she was started on bromocriptine because of the pregnancy  She has been taking this once a day without any side effects   However because of difficulty getting the prescription filled she thinks she has only taking this for the last 10 days or so   She did have menstrual cycle in late January    Past Medical History  Diagnosis Date  . 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  . Typhoid fever     hx of s/pabdominal  surgery for complication.  . Irregular menses     followed by Dr Kalman Shan and [facility] infertility clinic  . Depression   . Axillary mass     treated  with doxy 05/2006  . UTI (lower urinary tract infection)   . Pituitary microadenoma with hyperprolactinemia [facility])     Past Surgical History  Procedure Laterality Date  . Abdominal surgery      R/T typhoid fever 2002 and revision in 2004    Family History  Problem Relation Age of Onset  . Cancer Maternal Aunt     breast cancer at age 70yrs  . Diabetes Neg Hx     Social History:  reports that she has never smoked. She has never used smokeless tobacco. She reports that she drinks alcohol. She reports that she does not use illicit drugs.  Allergies:  Allergies  Allergen Reactions  . Ibuprofen Other (See Comments)    Chest pain      Medication List       This list is accurate as of: 08/12/15  1:39 PM.  Always use your most recent med list.               acetaminophen 325 MG tablet  Commonly known as:  TYLENOL  Take 650 mg by mouth every 6 (six) hours as needed. Reported on 07/15/2015     bromocriptine 2.5 MG tablet  Commonly known as:  PARLODEL  Take 1 tablet (2.5 mg total) by mouth daily.     PRENATAL VITAMINS PLUS 27-1 MG  Tabs  Take 1 tablet by mouth once.     PRENATAL VITAMINS PO  Take by mouth. Reported on 07/15/2015           REVIEW OF SYSTEMS:            PHYSICAL EXAM:  BP 116/74 mmHg  Pulse 70  Temp(Src) 99 F (37.2 C)  Resp 14  Ht 5\' 1"  (1.549 m)  Wt 175 lb 3.2 oz (79.47 kg)  BMI 33.12 kg/m2  SpO2 98%  LMP 04/29/2015  Exam not indicated   ASSESSMENT:     PROLACTINOMA causing amenorrhea and mild galactorrhea. She has a significantly high baseline prolactin level of 167  Currently the patient is on low-dose bromocriptine and has just started taking this Although it is desirable to wait another 3-4 weeks to check her prolactin she wants to check it now  Again emphasized that she should hold off on pregnancy until the prolactinoma is in remission     PLAN:   Check prolactin today Follow-up in 3 months again unless prolactin  significantly high  Dellamae Rosamilia 08/12/2015, 1:39 PM

## 2015-08-13 ENCOUNTER — Other Ambulatory Visit: Payer: Self-pay | Admitting: *Deleted

## 2015-08-13 LAB — PROLACTIN: Prolactin: 87.1 ng/mL — ABNORMAL HIGH (ref 4.8–23.3)

## 2015-08-13 NOTE — Progress Notes (Signed)
Quick Note:  Please let patient know that the prolactin is higher than before, needs to go back to Dostinex 0.5 mg twice a week instead of bromocriptine and see me in 6 weeks with labs ______

## 2015-08-15 ENCOUNTER — Other Ambulatory Visit: Payer: Self-pay

## 2015-08-15 MED ORDER — BROMOCRIPTINE MESYLATE 2.5 MG PO TABS: 5.0000 mg | ORAL_TABLET | Freq: Every day | ORAL | Status: DC

## 2015-11-04 ENCOUNTER — Other Ambulatory Visit: Payer: Medicaid Other

## 2015-11-07 ENCOUNTER — Ambulatory Visit (INDEPENDENT_AMBULATORY_CARE_PROVIDER_SITE_OTHER): Payer: Medicaid Other | Admitting: Endocrinology

## 2015-11-07 ENCOUNTER — Encounter: Payer: Self-pay | Admitting: Endocrinology

## 2015-11-07 VITALS — BP 112/62 | HR 71 | Ht 61.0 in | Wt 165.0 lb

## 2015-11-07 DIAGNOSIS — D352 Benign neoplasm of pituitary gland: Secondary | ICD-10-CM | POA: Diagnosis not present

## 2015-11-07 NOTE — Progress Notes (Signed)
Patient ID: [name], female   DOB: 04-13-1981, 35 y.o.   MRN: [id number]   Chief complaint: Follow-up of hyperprolactinemia  History of Present Illness:  Background history: She had been trying to conceive for approximately 3 years without any success.  Her periods were normal for two to three years after she had her daughter about 4 years ago.  She thinks she had normal regular monthly cycles last year until December. She has not had a cycle since June 05, 2014 Also she has noticed that her milk discharge from her breasts has continued since her last pregnancy and is about the same; she will get some milk discharge when she is lying on her side or when she is bathing.    Because of her lack of menstrual cycles and difficulty conceiving she saw her gynecologist who found her to have a fasting prolactin level of 167 in 4/16.    Initially had a 9 mm pituitary microadenoma on her MRI done in 10/2014  Recent history: She was started on Dostinex 0.5 mg in 7/16 which she was taking on Mondays and Thursdays This was increased to 0.5 mg on Monday and 0.25 mg on Thursdays on her visit in 8/16 since she was still having relatively high prolactin level and mild galactorrhea Had a miscarriage in 1/ 17 and at that time because of her high prolactin she was started on bromocriptine because of the pregnancy   She has been taking 5 mg once a day without any side effects Her prolactin was high on her last visit, had not taken her medication for 10 days prior to this  She has had normal menstrual cycles recently and no galactorrhea     Lab Results  Component Value Date   PROLACTIN 87.1* 08/12/2015   PROLACTIN 74.2* 07/10/2015   PROLACTIN 27.9* 04/11/2015   PROLACTIN 37.9 02/06/2015   PROLACTIN 167.6 10/10/2014     Past Medical History  Diagnosis Date  . 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  . Typhoid fever     hx of s/pabdominal  surgery for complication.  . Irregular menses     followed by Dr Kalman Shan and [facility] infertility clinic  . Depression   . 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 UTI (lower urinary tract infection)   . Pituitary microadenoma with hyperprolactinemia [facility])     Past Surgical History  Procedure Laterality Date  . Abdominal surgery      R/T typhoid fever 2002 and revision in 2004    Family History  Problem Relation Age of Onset  . Cancer Maternal Aunt     breast cancer at age 25yrs  . Diabetes Neg Hx     Social History:  reports that she has never smoked. She has never used smokeless tobacco. She reports that she drinks alcohol. She reports that she does not use illicit drugs.  Allergies:  Allergies  Allergen Reactions  . Ibuprofen Other (See Comments)    Chest pain      Medication List       This list is accurate as of: 11/07/15  9:31 AM.  Always use your most recent med list.               acetaminophen 325 MG tablet  Commonly known as:  TYLENOL  Take 650 mg by mouth every 6 (six) hours as needed. Reported on 07/15/2015  bromocriptine 2.5 MG tablet  Commonly known as:  PARLODEL  Take 2 tablets (5 mg total) by mouth daily.     PRENATAL VITAMINS PLUS 27-1 MG Tabs  Take 1 tablet by mouth once.     PRENATAL VITAMINS PO  Take by mouth. Reported on 07/15/2015           REVIEW OF SYSTEMS:        Recent Cold symptoms present    PHYSICAL EXAM:  BP 112/62 mmHg  Pulse 71  Ht 5\' 1"  (1.549 m)  Wt 165 lb (74.844 kg)  BMI 31.19 kg/m2  SpO2 99%  LMP 04/29/2015      ASSESSMENT/PLAN:     PROLACTINOMA causing amenorrhea and mild galactorrhea. She has a significantly high baseline prolactin level of 167  Currently the patient is on 5 mg bromocriptine and has no problems with her menstrual cycles are galactorrhea Will need to check her prolactin today and decide on further treatment plan She agrees to switch to Dostinex if  needed  Again emphasized that she should hold off on pregnancy until the hyperprolactinemia is consistently controlled        [name] 11/07/2015, 9:31 AM

## 2015-11-08 LAB — PROLACTIN: PROLACTIN: 110.7 ng/mL — AB (ref 4.8–23.3)

## 2015-11-08 NOTE — Progress Notes (Signed)
Quick Note:  Please let patient know that the prolactin level is higher than before. She needs to switch to Dostinex 0.5 mg twice a week, will need to see her with lab in 6 weeks instead of 3 months ______

## 2015-11-19 ENCOUNTER — Other Ambulatory Visit: Payer: Self-pay | Admitting: *Deleted

## 2015-11-19 MED ORDER — CABERGOLINE 0.5 MG PO TABS: ORAL_TABLET | ORAL | Status: DC

## 2015-12-31 ENCOUNTER — Other Ambulatory Visit: Payer: Medicaid Other

## 2015-12-31 DIAGNOSIS — D352 Benign neoplasm of pituitary gland: Secondary | ICD-10-CM

## 2016-01-01 LAB — PROLACTIN: Prolactin: 39.2 ng/mL — ABNORMAL HIGH (ref 4.8–23.3)

## 2016-01-07 ENCOUNTER — Ambulatory Visit (INDEPENDENT_AMBULATORY_CARE_PROVIDER_SITE_OTHER): Payer: Medicaid Other | Admitting: Endocrinology

## 2016-01-07 ENCOUNTER — Encounter: Payer: Self-pay | Admitting: Endocrinology

## 2016-01-07 VITALS — BP 110/70 | HR 83 | Ht 61.0 in | Wt 164.0 lb

## 2016-01-07 DIAGNOSIS — D352 Benign neoplasm of pituitary gland: Secondary | ICD-10-CM

## 2016-01-07 MED ORDER — CABERGOLINE 0.5 MG PO TABS: ORAL_TABLET | ORAL | Status: DC

## 2016-01-07 NOTE — Patient Instructions (Signed)
Monday 1 tab, Thursdays 1 1/2 tab of Dostinex

## 2016-01-07 NOTE — Progress Notes (Signed)
Patient ID: [name], female   DOB: 1980-10-25, 35 y.o.   MRN: [id number]   Chief complaint: Follow-up of hyperprolactinemia  History of Present Illness:  Background history: She had been trying to conceive for approximately 3 years without any success.  Her periods were normal for two to three years after she had her daughter about 4 years ago.  She thinks she had normal regular monthly cycles last year until December. She has not had a cycle since June 05, 2014 Also she has noticed that her milk discharge from her breasts has continued since her last pregnancy and is about the same; she will get some milk discharge when she is lying on her side or when she is bathing.    Because of her lack of menstrual cycles and difficulty conceiving she saw her gynecologist who found her to have a fasting prolactin level of 167 in 4/16.    Initially had a 9 mm pituitary microadenoma on her MRI done in 10/2014  Recent history: She was started on Dostinex 0.25 mg in 7/16 which she was taking on Mondays and Thursdays This was increased to 0.5 mg on Monday and 0.25 mg on Thursdays on her visit in 8/16 since she was still having relatively high prolactin level and mild galactorrhea Had a miscarriage in 1/ 17 and at that time because of her high prolactin she was started on bromocriptine because of the pregnancy  She had been taking 5 mg once a day without any side effects  Her prolactin was high on her last visit and she was switched to Dostinex 0.5 mg twice a week Although she thinks she is getting 2 tablets a week she is not taking them on consistent days  She is asking about changing her Dostinex as she thinks it is causing abdominal pain recently even though she had tolerated this well for several months previously on and off  She has had normal menstrual cycles recently and no galactorrhea   However her prolactin is still not normal  Lab Results  Component Value Date   PROLACTIN 39.2* 12/31/2015   PROLACTIN 110.7* 11/07/2015   PROLACTIN 87.1* 08/12/2015   PROLACTIN 74.2* 07/10/2015   PROLACTIN 27.9* 04/11/2015     Past Medical History  Diagnosis Date  . 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  . Typhoid fever     hx of s/pabdominal  surgery for complication.  . Irregular menses     followed by Dr Kalman Shan and [facility] infertility clinic  . Depression   . 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 UTI (lower urinary tract infection)   . Pituitary microadenoma with hyperprolactinemia [facility])     Past Surgical History  Procedure Laterality Date  . Abdominal surgery      R/T typhoid fever 2002 and revision in 2004    Family History  Problem Relation Age of Onset  . Cancer Maternal Aunt     breast cancer at age 46yrs  . Diabetes Neg Hx     Social History:  reports that she has never smoked. She has never used smokeless tobacco. She reports that she drinks alcohol. She reports that she does not use illicit drugs.  Allergies:  Allergies  Allergen Reactions  . Ibuprofen Other (See Comments)    Chest pain      Medication List       This list is accurate as  of: 01/07/16  5:21 PM.  Always use your most recent med list.               acetaminophen 325 MG tablet  Commonly known as:  TYLENOL  Take 650 mg by mouth every 6 (six) hours as needed. Reported on 07/15/2015     cabergoline 0.5 MG tablet  Commonly known as:  DOSTINEX  Take 1 tablet twice a week     PRENATAL VITAMINS PLUS 27-1 MG Tabs  Take 1 tablet by mouth once.     PRENATAL VITAMINS PO  Take by mouth. Reported on 07/15/2015           REVIEW OF SYSTEMS:        Abdominal pain Present in the last few days especially in the last 2 days in the right lower part of the abdomen    PHYSICAL EXAM:  BP 110/70 mmHg  Pulse 83  Ht 5\' 1"  (1.549 m)  Wt 164 lb (74.39 kg)  BMI 31.00 kg/m2  SpO2 99%  LMP  04/29/2015   Minimal right lower quadrant tenderness present.  No mass palpable in lower abdomen   ASSESSMENT/PLAN:     PROLACTINOMA causing amenorrhea and mild galactorrhea. She has a significantly high baseline prolactin level of 167  Currently the patient is on Dostinex 0.5 mg twice a week She thinks it is causing abdominal pain but this appears to be over her right ovary and only recent Not clear if she is consistently compliant with the medication as she is expressing a desire to change it again  Recommended that she add an extra half tablet of Dostinex to get her prolactin back to normal Discussed again that she needs to be on Dostinex until she is in remission from the prolactinoma even though she states she will stop the medication when her prolactin is normal.   Discussed that she will not able to get pregnant if she does not treat her hyperprolactinemia adequately  Again emphasized that she should hold off on pregnancy until the hyperprolactinemia is  controlled     She will need to call her gynecologist regarding the right lower quadrant pain  [name] 01/07/2016, 5:21 PM

## 2016-02-06 ENCOUNTER — Other Ambulatory Visit: Payer: Medicaid Other

## 2016-02-10 ENCOUNTER — Ambulatory Visit: Payer: Medicaid Other | Admitting: Endocrinology

## 2016-02-17 ENCOUNTER — Other Ambulatory Visit: Payer: Medicaid Other

## 2016-02-18 ENCOUNTER — Other Ambulatory Visit: Payer: Medicaid Other

## 2016-02-18 DIAGNOSIS — D352 Benign neoplasm of pituitary gland: Secondary | ICD-10-CM

## 2016-02-19 ENCOUNTER — Encounter: Payer: Self-pay | Admitting: Endocrinology

## 2016-02-19 ENCOUNTER — Ambulatory Visit (INDEPENDENT_AMBULATORY_CARE_PROVIDER_SITE_OTHER): Payer: Medicaid Other | Admitting: Endocrinology

## 2016-02-19 VITALS — BP 110/68 | HR 58 | Ht 61.0 in | Wt 164.0 lb

## 2016-02-19 DIAGNOSIS — D352 Benign neoplasm of pituitary gland: Secondary | ICD-10-CM | POA: Diagnosis not present

## 2016-02-19 LAB — PROLACTIN: Prolactin: 29.9 ng/mL — ABNORMAL HIGH (ref 4.8–23.3)

## 2016-02-19 NOTE — Progress Notes (Signed)
Patient ID: [name], female   DOB: 1980/08/05, 35 y.o.   MRN: [id number]   Chief complaint: Follow-up of hyperprolactinemia  History of Present Illness:  Background history: She had been trying to conceive for approximately 3 years without any success.  Her periods were normal for two to three years after she had her daughter about 4 years ago.  She thinks she had normal regular monthly cycles last year until December. She has not had a cycle since June 05, 2014 Also she has noticed that her milk discharge from her breasts has continued since her last pregnancy and is about the same; she will get some milk discharge when she is lying on her side or when she is bathing.    Because of her lack of menstrual cycles and difficulty conceiving she saw her gynecologist who found her to have a fasting prolactin level of 167 in 4/16.    Initially had a 9 mm pituitary microadenoma on her MRI done in 10/2014  Recent history: She was started on Dostinex 0.25 mg in 7/16 which she was taking on Mondays and Thursdays This was increased to 0.5 mg on Monday and 0.25 mg on Thursdays on her visit in 8/16 since she was still having relatively high prolactin level and mild galactorrhea Had a miscarriage in 1/ 17 and at that time because of her high prolactin she was started on bromocriptine because of the pregnancy  She had been taking 5 mg once a day without any side effects  Her prolactin was high on bromocriptine and she was switched to Dostinex 0.5 mg twice a week However with this her prolactin was still high at 39 Since 7/17 she has been taking 1 tablet twice a week with an extra half tablet once a week She had been concerned about some abdominal discomfort previously but this is improving Asking about whether Dostinex causes insomnia  She has had normal menstrual cycles which are regular and no galactorrhea recently  However her prolactin is still not normal, however relatively  better than usual  Lab Results  Component Value Date   PROLACTIN 29.9 (H) 02/18/2016   PROLACTIN 39.2 (H) 12/31/2015   PROLACTIN 110.7 (H) 11/07/2015   PROLACTIN 87.1 (H) 08/12/2015   PROLACTIN 74.2 (H) 07/10/2015     Past Medical History:  Diagnosis Date  . Axillary mass    treated with doxy 05/2006  . Depression   . Female infertility    histosalpingogram showed left fallopian tube occlusion, right partially open.  . Irregular menses    followed by Dr Kalman Shan and [facility] infertility clinic  . Pituitary microadenoma with hyperprolactinemia ([place])   . Typhoid fever    hx of s/pabdominal  surgery for complication.  [facility] UTI (lower urinary tract infection)     Past Surgical History:  Procedure Laterality Date  . ABDOMINAL SURGERY     R/T typhoid fever 2002 and revision in 2004    Family History  Problem Relation Age of Onset  . Cancer Maternal Aunt     breast cancer at age 33yrs  . Diabetes Neg Hx     Social History:  reports that she has never smoked. She has never used smokeless tobacco. She reports that she drinks alcohol. She reports that she does not use drugs.  Allergies:  Allergies  Allergen Reactions  . Ibuprofen Other (See Comments)    Chest pain      Medication List       Accurate  as of 02/19/16 10:21 AM. Always use your most recent med list.          acetaminophen 325 MG tablet Commonly known as:  TYLENOL Take 650 mg by mouth every 6 (six) hours as needed. Reported on 07/15/2015   cabergoline 0.5 MG tablet Commonly known as:  DOSTINEX Take 1 tablet on Monday and 1-1/2 tablet on Thursday   PRENATAL VITAMINS PLUS 27-1 MG Tabs Take 1 tablet by mouth once.   PRENATAL VITAMINS PO Take by mouth. Reported on 07/15/2015          REVIEW OF SYSTEMS:        Asking about insomnia, not new Mild vague headaches    PHYSICAL EXAM:  BP 110/68   Pulse (!) 58   Ht 5\' 1"  (1.549 m)   Wt 164 lb (74.4 kg)   LMP 04/29/2015   BMI 30.99 kg/m       ASSESSMENT/PLAN:     PROLACTINOMA causing amenorrhea and mild galactorrhea. She has a significantly high baseline prolactin level of 167  Currently the patient is on Dostinex 0.5 mg alternating with 0.75 mg twice a week  She thinks she has been compliant with this regimen Although her menstrual cycles have been regular she still has a mildly increased prolactin of about 30. Since her dose was increased only about a month ago will not change her regimen as yet Encouraged her to follow-up with PCP for various other symptoms  Follow-up in 3 months for repeat prolactin  [name] 02/19/2016, 10:21 AM

## 2016-02-19 NOTE — Patient Instructions (Signed)
Stay on 2 1/2 tabs per week

## 2016-02-20 ENCOUNTER — Ambulatory Visit: Payer: Medicaid Other | Admitting: Endocrinology

## 2016-04-18 ENCOUNTER — Encounter (HOSPITAL_COMMUNITY): Payer: Self-pay

## 2016-05-04 ENCOUNTER — Other Ambulatory Visit: Payer: Self-pay | Admitting: Endocrinology

## 2016-05-04 ENCOUNTER — Other Ambulatory Visit: Payer: Self-pay | Admitting: Advanced Practice Midwife

## 2016-05-28 ENCOUNTER — Other Ambulatory Visit: Payer: Self-pay

## 2016-05-28 MED ORDER — CABERGOLINE 0.5 MG PO TABS: ORAL_TABLET | ORAL | 0 refills | Status: DC

## 2016-06-04 ENCOUNTER — Other Ambulatory Visit: Payer: Medicaid Other

## 2016-06-10 ENCOUNTER — Ambulatory Visit: Payer: Medicaid Other | Admitting: Endocrinology

## 2016-06-23 ENCOUNTER — Other Ambulatory Visit: Payer: Self-pay | Admitting: Endocrinology

## 2016-06-24 ENCOUNTER — Other Ambulatory Visit: Payer: Self-pay | Admitting: Advanced Practice Midwife

## 2016-06-29 ENCOUNTER — Telehealth: Payer: Self-pay | Admitting: Endocrinology

## 2016-06-29 NOTE — Telephone Encounter (Signed)
Pt was calling to check the status of her insurance form that was dropped off last week (I placed it on your desk).  She needs the information that was requested on the print out she brought in to be sent to her insurance.

## 2016-06-30 NOTE — Telephone Encounter (Signed)
The paper is on the Dr's desk and he has to be the one to fill it out

## 2016-06-30 NOTE — Telephone Encounter (Signed)
On Dr. Ronnie Derby desk for review

## 2016-06-30 NOTE — Telephone Encounter (Signed)
The patient called today and said that it is very important to get the papers filled out because it has to do with her insurance.

## 2016-07-02 NOTE — Telephone Encounter (Signed)
Patient is calling on the status of her papers, they need to be filled out and sent in today.   Please call her back today let her know when it is sent. (she asked)

## 2016-07-02 NOTE — Telephone Encounter (Signed)
They have been given to Dr. Dwyane Dee for review

## 2016-07-15 ENCOUNTER — Encounter: Payer: Self-pay | Admitting: Endocrinology

## 2016-07-15 ENCOUNTER — Other Ambulatory Visit: Payer: Medicaid Other

## 2016-07-15 DIAGNOSIS — D352 Benign neoplasm of pituitary gland: Secondary | ICD-10-CM

## 2016-07-16 LAB — PROLACTIN: PROLACTIN: 28 ng/mL — AB (ref 4.8–23.3)

## 2016-07-21 ENCOUNTER — Ambulatory Visit: Payer: Medicaid Other | Admitting: Endocrinology

## 2016-07-21 ENCOUNTER — Telehealth: Payer: Self-pay | Admitting: Endocrinology

## 2016-07-21 NOTE — Telephone Encounter (Signed)
Patient no showed today's appt. Please advise on how to follow up. °A. No follow up necessary. °B. Follow up urgent. Contact patient immediately. °C. Follow up necessary. Contact patient and schedule visit in ___ days. °D. Follow up advised. Contact patient and schedule visit in ____weeks. ° °

## 2016-07-27 ENCOUNTER — Other Ambulatory Visit: Payer: Self-pay | Admitting: Endocrinology

## 2016-07-27 NOTE — Telephone Encounter (Signed)
Needs to be rescheduled for follow-up, first available

## 2016-07-31 ENCOUNTER — Telehealth: Payer: Self-pay | Admitting: Endocrinology

## 2016-07-31 ENCOUNTER — Ambulatory Visit: Payer: Medicaid Other | Admitting: Endocrinology

## 2016-07-31 NOTE — Telephone Encounter (Signed)
Patient no showed today's appt. Please advise on how to follow up. °A. No follow up necessary. °B. Follow up urgent. Contact patient immediately. °C. Follow up necessary. Contact patient and schedule visit in ___ days. °D. Follow up advised. Contact patient and schedule visit in ____weeks. ° °

## 2016-08-02 NOTE — Telephone Encounter (Signed)
Needs to reschedule follow-up within the next month. Let her know that she is already had 2 no-shows

## 2016-08-27 ENCOUNTER — Ambulatory Visit: Payer: Medicaid Other | Admitting: Endocrinology

## 2016-08-31 ENCOUNTER — Encounter: Payer: Self-pay | Admitting: Endocrinology

## 2016-08-31 ENCOUNTER — Encounter: Payer: Self-pay | Admitting: Neurology

## 2016-08-31 ENCOUNTER — Ambulatory Visit (INDEPENDENT_AMBULATORY_CARE_PROVIDER_SITE_OTHER): Payer: Medicaid Other | Admitting: Endocrinology

## 2016-08-31 VITALS — BP 100/68 | HR 65 | Ht 61.0 in | Wt 162.0 lb

## 2016-08-31 DIAGNOSIS — R51 Headache: Secondary | ICD-10-CM | POA: Diagnosis not present

## 2016-08-31 DIAGNOSIS — G8929 Other chronic pain: Secondary | ICD-10-CM

## 2016-08-31 DIAGNOSIS — D352 Benign neoplasm of pituitary gland: Secondary | ICD-10-CM

## 2016-08-31 NOTE — Progress Notes (Signed)
Patient ID: [name], female   DOB: 02/10/1981, 36 y.o.   MRN: [id number]   Chief complaint: Follow-up of hyperprolactinemia  History of Present Illness:  Background history: She had been trying to conceive for approximately 3 years without any success.  Her periods were normal for two to three years after she had her daughter about 4 years ago.  She thinks she had normal regular monthly cycles last year until December. She has not had a cycle since June 05, 2014 Also she has noticed that her milk discharge from her breasts has continued since her last pregnancy and is about the same; she will get some milk discharge when she is lying on her side or when she is bathing.    Because of her lack of menstrual cycles and difficulty conceiving she saw her gynecologist who found her to have a fasting prolactin level of 167 in 4/16.    Initially had a 9 mm pituitary microadenoma on her MRI done in 10/2014  Recent history: She was started on Dostinex 0.25 mg in 7/16 which she was taking on Mondays and Thursdays This was increased to 0.5 mg on Monday and 0.25 mg on Thursdays on her visit in 8/16 since she was still having relatively high prolactin level and mild galactorrhea Had a miscarriage in 1/ 17 and at that time because of her high prolactin she was started on bromocriptine because of the pregnancy  She had been taking 5 mg once a day without any side effects  Her prolactin was high on bromocriptine and she was switched to Dostinex 0.5 mg twice a week However with this her prolactin was still high at 39 Since 7/17 she has been taking 1 tablet twice a week with an extra half tablet once a week She is continuing this doses but she says that she takes the extra half tablet separately on a third day of the week  She has had normal menstrual cycles which are regular and no galactorrhea recently  However her prolactin is still not normal, last level done about 6 weeks  ago   Lab Results  Component Value Date   PROLACTIN 28.0 (H) 07/15/2016   PROLACTIN 29.9 (H) 02/18/2016   PROLACTIN 39.2 (H) 12/31/2015   PROLACTIN 110.7 (H) 11/07/2015   PROLACTIN 87.1 (H) 08/12/2015    Headaches: See review of systems    Past Medical History:  Diagnosis Date  . Axillary mass    treated with doxy 05/2006  . Depression   . Female infertility    histosalpingogram showed left fallopian tube occlusion, right partially open.  . Irregular menses    followed by Dr Kalman Shan and [facility] infertility clinic  . Pituitary microadenoma with hyperprolactinemia ([place])   . Typhoid fever    hx of s/pabdominal  surgery for complication.  Marland Kitchen UTI (lower urinary tract infection)     Past Surgical History:  Procedure Laterality Date  . ABDOMINAL SURGERY     R/T typhoid fever 2002 and revision in 2004    Family History  Problem Relation Age of Onset  . Cancer Maternal Aunt     breast cancer at age 25yrs  . Diabetes Neg Hx     Social History:  reports that she has never smoked. She has never used smokeless tobacco. She reports that she drinks alcohol. She reports that she does not use drugs.  Allergies:  Allergies  Allergen Reactions  . Ibuprofen Other (See Comments)    Chest pain  Allergies as of 08/31/2016      Reactions   Ibuprofen Other (See Comments)   Chest pain      Medication List       Accurate as of 08/31/16  9:26 AM. Always use your most recent med list.          acetaminophen 325 MG tablet Commonly known as:  TYLENOL Take 650 mg by mouth every 6 (six) hours as needed. Reported on 07/15/2015   cabergoline 0.5 MG tablet Commonly known as:  DOSTINEX TAKE 1 TABLET(0.5 MG) BY MOUTH 2 TIMES A WEEK   PRENATAL VITAMINS PO Take by mouth. Reported on 07/15/2015         REVIEW OF SYSTEMS:        Asking about insomnia, not new Mild left frontal headaches off and on For quite some time, she is asking about evaluation, has not been treated by  PCP recently    PHYSICAL EXAM:  BP 100/68   Pulse 65   Ht 5\' 1"  (1.549 m)   Wt 162 lb (73.5 kg)   SpO2 99%   BMI 30.61 kg/m     ASSESSMENT/PLAN:     PROLACTINOMA causing amenorrhea and mild galactorrhea. She has a significantly high baseline prolactin level of 167  Currently the patient is on Dostinex 0.5 mg alternating with 0.75 mg twice a week  She thinks she has been compliant with this regimen  Although her menstrual cycles have been regular her prolactin level is still mildly increased as before  The patient is very reluctant to continue the medication as she does not like to take pills. However encouraged her to continue with the medication to control the hyperprolactinemia, preventing estrogen deficiency and potentially growth of the prolactin producing tumor  She again says that she wants to stop the medication on her own  Since she is having headaches which are of unclear etiology have recommended MRI but she wants to first see the neurologist.  She has not been able to get a referral from her PCP and will do so today  Follow-up in 4 months for repeat prolactin  Olanda Boughner 08/31/2016, 9:26 AM

## 2016-09-01 ENCOUNTER — Other Ambulatory Visit: Payer: Self-pay | Admitting: Endocrinology

## 2016-09-17 ENCOUNTER — Telehealth: Payer: Self-pay | Admitting: Endocrinology

## 2016-09-17 NOTE — Telephone Encounter (Signed)
[employer]  Is calling on the status of a insurance form, to see if you received it.  [phone]

## 2016-10-10 ENCOUNTER — Encounter (HOSPITAL_COMMUNITY): Payer: Self-pay

## 2016-10-10 ENCOUNTER — Emergency Department (HOSPITAL_COMMUNITY)
Admission: EM | Admit: 2016-10-10 | Discharge: 2016-10-10 | Disposition: A | Discharge status: Home / Self Care | Payer: Medicaid Other | Attending: Emergency Medicine | Admitting: Emergency Medicine

## 2016-10-10 DIAGNOSIS — J0110 Acute frontal sinusitis, unspecified: Secondary | ICD-10-CM | POA: Diagnosis not present

## 2016-10-10 DIAGNOSIS — R51 Headache: Secondary | ICD-10-CM | POA: Diagnosis present

## 2016-10-10 LAB — POC URINE PREG, ED: PREG TEST UR: NEGATIVE

## 2016-10-10 MED ORDER — DEXAMETHASONE SODIUM PHOSPHATE 10 MG/ML IJ SOLN: 10.0000 mg | Freq: Once | INTRAMUSCULAR | Status: DC

## 2016-10-10 MED ORDER — FLUTICASONE PROPIONATE 50 MCG/ACT NA SUSP: 1.0000 | Freq: Every day | NASAL | 0 refills | Status: DC

## 2016-10-10 MED ORDER — DEXAMETHASONE SODIUM PHOSPHATE 10 MG/ML IJ SOLN
10.0000 mg | Freq: Once | INTRAMUSCULAR | Status: AC
  Administered 2016-10-10: 10 mg via INTRAMUSCULAR
  Filled 2016-10-10: qty 1

## 2016-10-10 NOTE — Discharge Instructions (Signed)
Please read and follow all provided instructions.  Your diagnoses today include:  1. Acute non-recurrent frontal sinusitis     You appear to have an upper respiratory infection (URI). An upper respiratory tract infection, or cold, is a viral infection of the air passages leading to the lungs. It should improve gradually after 5-7 days. You may have a lingering cough that lasts for 2- 4 weeks after the infection.  Tests performed today include: Vital signs. See below for your results today.   Medications prescribed:   Take any prescribed medications only as directed. Treatment for your infection is aimed at treating the symptoms. There are no medications, such as antibiotics, that will cure your infection.   Home care instructions:  Follow any educational materials contained in this packet.   Your illness is contagious and can be spread to others, especially during the first 3 or 4 days. It cannot be cured by antibiotics or other medicines. Take basic precautions such as washing your hands often, covering your mouth when you cough or sneeze, and avoiding public places where you could spread your illness to others.   Please continue drinking plenty of fluids.  Use over-the-counter medicines as needed as directed on packaging for symptom relief.  You may also use ibuprofen or tylenol as directed on packaging for pain or fever.  Do not take multiple medicines containing Tylenol or acetaminophen to avoid taking too much of this medication.  Follow-up instructions: Please follow-up with your primary care provider in the next 3 days for further evaluation of your symptoms if you are not feeling better.   Return instructions:  Please return to the Emergency Department if you experience worsening symptoms.  RETURN IMMEDIATELY IF you develop shortness of breath, confusion or altered mental status, a new rash, become dizzy, faint, or poorly responsive, or are unable to be cared for at home. Please  return if you have persistent vomiting and cannot keep down fluids or develop a fever that is not controlled by tylenol or motrin.   Please return if you have any other emergent concerns.  Additional Information:  Your vital signs today were: BP 123/88    Pulse 68    Temp 99 F (37.2 C) (Oral)    Resp 18    SpO2 100%  If your blood pressure (BP) was elevated above 135/85 this visit, please have this repeated by your doctor within one month. --------------

## 2016-10-10 NOTE — ED Provider Notes (Signed)
[facility] DEPT Provider Note   CSN: [id number] Arrival date & time: 10/10/16  1528   By signing my name below, I, [name], attest that this documentation has been prepared under the direction and in the presence of Shary Decamp, PA-C. Electronically Signed: [name], ED Scribe. 10/10/2016. 5:29 PM.   History   Chief Complaint Chief Complaint  Patient presents with  . fever/chills/headache    The history is provided by the patient. No language interpreter was used.   HPI Comments:  [name] is a 36 y.o. female who presents to the Emergency Department complaining of a constant headache/cough/congestion/rhinorrhea x 4 days. Pt complains of associated sneezing, chills, subjective fever, productive cough. Temperature is 99 in the ED. She is also requesting a pregnancy test. She has taken tylenol with no relief. Denies  ear pain, nausea, vomiting, diarrhea.   Past Medical History:  Diagnosis Date  . Axillary mass    treated with doxy 05/2006  . Depression   . Female infertility    histosalpingogram showed left fallopian tube occlusion, right partially open.  . Irregular menses    followed by Dr Kalman Shan and [facility] infertility clinic  . Pituitary microadenoma with hyperprolactinemia ([place])   . Typhoid fever    hx of s/pabdominal  surgery for complication.  Marland Kitchen UTI (lower urinary tract infection)     Patient Active Problem List   Diagnosis Date Noted  . Elevated fasting prolactin level of 167 ng/ml 10/11/2014  . Worsening headaches 10/11/2014  . Preventative health care 12/16/2012  . Acute low back pain due to trauma 12/16/2012  . BOILS, RECURRENT 08/27/2009  . ABDOMINAL PREGNANCY WITH INTRAUTERINE PREGNANCY 12/29/2007  . DEPRESSION 12/28/2007  . BLEEDING GUMS 12/28/2007  . UNSPECIFIED SUBJECTIVE VISUAL DISTURBANCE 07/19/2007  . EPISTAXIS 07/19/2007  . HEMOPTYSIS 07/19/2007  . ABDOMINAL PAIN, CHRONIC 07/29/2006  . Typhoid fever 06/03/2006  .  IRREGULAR MENSES 06/03/2006  . Infertility, female 06/03/2006    Past Surgical History:  Procedure Laterality Date  . ABDOMINAL SURGERY     R/T typhoid fever 2002 and revision in 2004    OB History    Gravida Para Term Preterm AB Living   3 2 2     2    SAB TAB Ectopic Multiple Live Births                   Home Medications    Prior to Admission medications   Medication Sig Start Date End Date Taking? Authorizing Provider  acetaminophen (TYLENOL) 325 MG tablet Take 650 mg by mouth every 6 (six) hours as needed. Reported on 07/15/2015    Historical Provider, MD  cabergoline (DOSTINEX) 0.5 MG tablet TAKE 1 TABLET(0.5 MG) on Mondays and 1-1/2 tablet on Thursdays 09/01/16   Elayne Snare, MD  Prenatal Multivit-Min-Fe-FA (PRENATAL VITAMINS PO) Take by mouth. Reported on 07/15/2015    Historical Provider, MD    Family History Family History  Problem Relation Age of Onset  . Cancer Maternal Aunt     breast cancer at age 30yrs  . Diabetes Neg Hx     Social History Social History  Substance Use Topics  . Smoking status: Never Smoker  . Smokeless tobacco: Never Used  . Alcohol use 0.0 oz/week     Comment: Occasionally not with pregnancy     Allergies   Ibuprofen   Review of Systems Review of Systems  Constitutional: Positive for chills and fever.  HENT: Positive for sneezing. Negative for congestion,  ear pain and rhinorrhea.   Respiratory: Positive for cough.   Gastrointestinal: Negative for diarrhea, nausea and vomiting.  Neurological: Positive for headaches.     Physical Exam Updated Vital Signs BP 123/88   Pulse 68   Temp 99 F (37.2 C) (Oral)   Resp 18   SpO2 100%   Physical Exam  Constitutional: She is oriented to person, place, and time. Vital signs are normal. She appears well-developed and well-nourished. No distress.  HENT:  Head: Normocephalic and atraumatic.  Right Ear: Hearing, tympanic membrane, external ear and ear canal normal.  Left Ear:  Hearing, tympanic membrane, external ear and ear canal normal.  Nose: Nose normal.  Mouth/Throat: Uvula is midline, oropharynx is clear and moist and mucous membranes are normal. No trismus in the jaw. No oropharyngeal exudate, posterior oropharyngeal erythema or tonsillar abscesses.  Eyes: Conjunctivae and EOM are normal. Pupils are equal, round, and reactive to light.  Neck: Normal range of motion. Neck supple. No tracheal deviation present.  Cardiovascular: Normal rate, regular rhythm, S1 normal, S2 normal, normal heart sounds, intact distal pulses and normal pulses.   Pulmonary/Chest: Effort normal and breath sounds normal. No respiratory distress. She has no decreased breath sounds. She has no wheezes. She has no rhonchi. She has no rales.  Abdominal: Normal appearance and bowel sounds are normal. She exhibits no distension. There is no tenderness.  Musculoskeletal: Normal range of motion.  Neurological: She is alert and oriented to person, place, and time.  Skin: Skin is warm and dry.  Psychiatric: She has a normal mood and affect. Her speech is normal and behavior is normal. Thought content normal.  Nursing note and vitals reviewed.  ED Treatments / Results  DIAGNOSTIC STUDIES:  Oxygen Saturation is 100% on RA, normal by my interpretation.    COORDINATION OF CARE:  5:26 PM Discussed treatment plan with pt at bedside and pt agreed to plan.   Labs (all labs ordered are listed, but only abnormal results are displayed) Labs Reviewed - No data to display  EKG  EKG Interpretation None       Radiology No results found.  Procedures Procedures (including critical care time)  Medications Ordered in ED Medications - No data to display   Initial Impression / Assessment and Plan / ED Course  I have reviewed the triage vital signs and the nursing notes.  Pertinent labs & imaging results that were available during my care of the patient were reviewed by me and considered in my  medical decision making (see chart for details).  Final Clinical Impressions(s) / ED Diagnoses     {I have reviewed the relevant previous healthcare records.  {I obtained HPI from historian.   ED Course:  Assessment: Pt is a 36 y.o. female presents with URI symptoms x 4 days. Associated headache/congestion/cough/rhinorrhea. No N/V/D. On exam, pt in NAD. VSS. Afebrile. Lungs CTA, Heart RRR. Abdomen nontender/soft. Patients symptoms are consistent with URI, likely viral etiology. Discussed that antibiotics are not indicated for viral infections. Likely acute sinusitis. Given Flonase. Decadron in ED. Requested pregnancy test in ED, which was negative. Pt will be discharged with symptomatic treatment.  Verbalizes understanding and is agreeable with plan. Pt is hemodynamically stable & in NAD prior to dc.  Disposition/Plan:  DC Home Additional Verbal discharge instructions given and discussed with patient.  Pt Instructed to f/u with PCP in the next week for evaluation and treatment of symptoms. Return precautions given Pt acknowledges and agrees with plan  Supervising  Physician Gareth Morgan, MD  Final diagnoses:  Acute non-recurrent frontal sinusitis    New Prescriptions New Prescriptions   No medications on file  I personally performed the services described in this documentation, which was scribed in my presence. The recorded information has been reviewed and is accurate.    Shary Decamp, PA-C 10/10/16 1817    Gareth Morgan, MD 10/12/16 [phone]

## 2016-10-10 NOTE — ED Triage Notes (Signed)
Patient complains of fever, headache, chills and cold symptoms x 4 days, NAD.

## 2016-11-05 ENCOUNTER — Ambulatory Visit (INDEPENDENT_AMBULATORY_CARE_PROVIDER_SITE_OTHER): Payer: Medicaid Other | Admitting: Neurology

## 2016-11-05 ENCOUNTER — Encounter: Payer: Self-pay | Admitting: Neurology

## 2016-11-05 VITALS — BP 102/62 | HR 66 | Ht 61.0 in | Wt 164.0 lb

## 2016-11-05 DIAGNOSIS — R51 Headache: Secondary | ICD-10-CM

## 2016-11-05 DIAGNOSIS — R519 Headache, unspecified: Secondary | ICD-10-CM

## 2016-11-05 DIAGNOSIS — D352 Benign neoplasm of pituitary gland: Secondary | ICD-10-CM

## 2016-11-05 NOTE — Patient Instructions (Signed)
Migraine Recommendations: 1.  We will hold off on starting a daily medication such as topiramate for now.  If at any point you which to start it between now and follow up, contact me. 2.  Take Tylenol as needed.  Limit use of pain relievers to no more than 2 days out of the week.  These medications include acetaminophen, ibuprofen, triptans and narcotics.  This will help reduce risk of rebound headaches. 3.  Be aware of common food triggers such as processed sweets, processed foods with nitrites (such as deli meat, hot dogs, sausages), foods with MSG, alcohol (such as wine), chocolate, certain cheeses, certain fruits (dried fruits, some citrus fruit), vinegar, diet soda. 4.  Avoid caffeine 5.  Routine exercise 6.  Proper sleep hygiene 7.  Stay adequately hydrated with water 8.  Keep a headache diary. 9.  Maintain proper stress management. 10.  Do not skip meals. 11.  Consider supplements:  Magnesium citrate 400mg  to 600mg  daily, riboflavin 400mg , Coenzyme Q 10 100mg  three times daily 12. Headaches are likely migraine.  However, due to your known prolactinoma and worsening headaches, we will get MRI of brain and pituitary with and without contrast.

## 2016-11-05 NOTE — Progress Notes (Signed)
NEUROLOGY CONSULTATION NOTE  [name] MRN: [id number] DOB: [birth year]  Referring provider: Dr. 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  History supplemented by endocrine note.  She has history of hyperprolactinemia with headaches, irregular menstrual cycle.  She has history of mild headaches when she was young, but started developing increased headaches in 2016.  MRI of brain with and without contrast from 11/20/14 was personally reviewed and revealed a 9 mm hypoenhancing microadenoma without any complicating features.  Prolactin level was found to be in the 160s.  Prolactin level from 07/15/16 was 28, which has been trending down from last year.  Despite treatment for hyperprolactinemia, she has had worsening headaches since 2017.  They are described as pounding left frontal 7-8/10 pain. They last from minutes up to an hour.  They are worse around her menses, lasting for the entire week.  Otherwise, she may have 1 to 2 headache days a week.  In all, she has approximately 10 to 11 headache days per month.  They are associated with photophobia and phonophobia.  She denies nausea, visual disturbance, dizziness or unilateral numbness or weakness.  It is not a thunderclap headache.  Tylenol sometimes helps, but she typically declines taking medication.    Caffeine:  Rarely drinks coffee.  Sometimes drinks tea Exercise:  Routine, 2-3 days per week Hydration:  Yes Sleep:  Varies. Family history of headache:  Her mom had brief history of headaches  PAST MEDICAL HISTORY: Past Medical History:  Diagnosis Date  . Axillary mass    treated with doxy 05/2006  . Depression   . Female infertility    histosalpingogram showed left fallopian tube occlusion, right partially open.  . Irregular menses    followed by Dr Kalman Shan and [facility] infertility clinic  . Pituitary  microadenoma with hyperprolactinemia ([place])   . Typhoid fever    hx of s/pabdominal  surgery for complication.  Marland Kitchen UTI (lower urinary tract infection)     PAST SURGICAL HISTORY: Past Surgical History:  Procedure Laterality Date  . ABDOMINAL SURGERY     R/T typhoid fever 2002 and revision in 2004    MEDICATIONS: Current Outpatient Prescriptions on File Prior to Visit  Medication Sig Dispense Refill  . acetaminophen (TYLENOL) 325 MG tablet Take 650 mg by mouth every 6 (six) hours as needed. Reported on 07/15/2015    . cabergoline (DOSTINEX) 0.5 MG tablet TAKE 1 TABLET(0.5 MG) on Mondays and 1-1/2 tablet on Thursdays 10 tablet 2  . fluticasone (FLONASE) 50 MCG/ACT nasal spray Place 1 spray into both nostrils daily. 16 g 0  . Prenatal Multivit-Min-Fe-FA (PRENATAL VITAMINS PO) Take by mouth. Reported on 07/15/2015     No current facility-administered medications on file prior to visit.     ALLERGIES: Allergies  Allergen Reactions  . Ibuprofen Other (See Comments)    Chest pain    FAMILY HISTORY: Family History  Problem Relation Age of Onset  . Cancer Maternal Aunt        breast cancer at age 27yrs  . Diabetes Neg Hx     SOCIAL HISTORY: Social History   Social History  . Marital status: Married    Spouse name: N/A  . Number of children: N/A  . Years of education: N/A   Occupational History  . Not on file.   Social History Main Topics  . Smoking status: Never  Smoker  . Smokeless tobacco: Never Used  . Alcohol use 0.0 oz/week     Comment: Occasionally not with pregnancy  . Drug use: No  . Sexual activity: Yes    Partners: Male    Birth control/ protection: None     Comment: Occasional condoms   Other Topics Concern  . Not on file   Social History Narrative   Financial assistance approved for 100% discount at [facility] and has [facility] card, Bonna Gains Aug 02, 2009.   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    REVIEW OF SYSTEMS: Constitutional: No fevers, chills,  or sweats, no generalized fatigue, change in appetite Eyes: No visual changes, double vision, eye pain Ear, nose and throat: No hearing loss, ear pain, nasal congestion, sore throat Cardiovascular: No chest pain, palpitations Respiratory:  No shortness of breath at rest or with exertion, wheezes GastrointestinaI: No nausea, vomiting, diarrhea, abdominal pain, fecal incontinence Genitourinary:  No dysuria, urinary retention or frequency Musculoskeletal:  No neck pain, back pain Integumentary: No rash, pruritus, skin lesions Neurological: as above Psychiatric: No depression, insomnia, anxiety Endocrine: No palpitations, fatigue, diaphoresis, mood swings, change in appetite, change in weight, increased thirst Hematologic/Lymphatic:  No purpura, petechiae. Allergic/Immunologic: no itchy/runny eyes, nasal congestion, recent allergic reactions, rashes  PHYSICAL EXAM: Vitals:   11/05/16 0751  BP: 102/62  Pulse: 66   General: No acute distress.  Patient appears well-groomed.  Head:  Normocephalic/atraumatic Eyes:  fundi examined but not visualized Neck: supple, no paraspinal tenderness, full range of motion Back: No paraspinal tenderness Heart: regular rate and rhythm Lungs: Clear to auscultation bilaterally. Vascular: No carotid bruits. Neurological Exam: Mental status: alert and oriented to person, place, and time, recent and remote memory intact, fund of knowledge intact, attention and concentration intact, speech fluent and not dysarthric, language intact. Cranial nerves: CN I: not tested CN II: pupils equal, round and reactive to light, visual fields intact CN III, IV, VI:  full range of motion, no nystagmus, no ptosis CN V: facial sensation intact CN VII: upper and lower face symmetric CN VIII: hearing intact CN IX, X: gag intact, uvula midline CN XI: sternocleidomastoid and trapezius muscles intact CN XII: tongue midline Bulk & Tone: normal, no fasciculations. Motor:  5/5  throughout  Sensation: temperature and vibration sensation intact. Deep Tendon Reflexes:  2+ throughout, toes downgoing.  Finger to nose testing:  Without dysmetria.  Heel to shin:  Without dysmetria.  Gait:  Normal station and stride.  Able to turn and tandem walk. Romberg negative.  IMPRESSION: Worsening headaches.  Likely migraine.  However, with history of prolactinoma, we should get an MRI of the brain and pituitary in order to rule out a secondary etiology.  PLAN: 1.  MRI of brain and pituitary with and without contrast 2.  She would rather not start a preventative medication.  She will first try vitamins and supplements (magnesium citrate, riboflavin, CoQ10) 3.  She will follow sleep hygiene recommendations 4.  Continue hydration and exercise 5.  Follow up in 3 months.  If she wishes to start a daily preventative (we discussed topiramate and its side effects), she will contact us.  Thank you for allowing me to take part in the care of this patient.  Metta Clines, DO  CC: Elayne Snare, MD

## 2016-11-11 LAB — GLUCOSE, POCT (MANUAL RESULT ENTRY): POC Glucose: 73 mg/dl (ref 70–99)

## 2016-12-31 ENCOUNTER — Other Ambulatory Visit: Payer: Medicaid Other

## 2017-01-05 ENCOUNTER — Ambulatory Visit: Payer: Medicaid Other | Admitting: Endocrinology

## 2017-02-24 ENCOUNTER — Emergency Department (HOSPITAL_COMMUNITY): Payer: Medicaid Other

## 2017-02-24 ENCOUNTER — Encounter (HOSPITAL_COMMUNITY): Payer: Self-pay | Admitting: Emergency Medicine

## 2017-02-24 ENCOUNTER — Inpatient Hospital Stay (HOSPITAL_COMMUNITY)
Admission: EM | Admit: 2017-02-24 | Discharge: 2017-02-25 | DRG: 872 | Disposition: A | Discharge status: Home / Self Care | Payer: Medicaid Other | Attending: Family Medicine | Admitting: Family Medicine

## 2017-02-24 DIAGNOSIS — E221 Hyperprolactinemia: Secondary | ICD-10-CM | POA: Diagnosis present

## 2017-02-24 DIAGNOSIS — N12 Tubulo-interstitial nephritis, not specified as acute or chronic: Secondary | ICD-10-CM

## 2017-02-24 DIAGNOSIS — F329 Major depressive disorder, single episode, unspecified: Secondary | ICD-10-CM | POA: Diagnosis present

## 2017-02-24 DIAGNOSIS — N83202 Unspecified ovarian cyst, left side: Secondary | ICD-10-CM | POA: Diagnosis present

## 2017-02-24 DIAGNOSIS — Z7951 Long term (current) use of inhaled steroids: Secondary | ICD-10-CM | POA: Diagnosis not present

## 2017-02-24 DIAGNOSIS — N390 Urinary tract infection, site not specified: Secondary | ICD-10-CM | POA: Diagnosis not present

## 2017-02-24 DIAGNOSIS — N10 Acute tubulo-interstitial nephritis: Secondary | ICD-10-CM | POA: Diagnosis not present

## 2017-02-24 DIAGNOSIS — A419 Sepsis, unspecified organism: Secondary | ICD-10-CM

## 2017-02-24 DIAGNOSIS — Z79899 Other long term (current) drug therapy: Secondary | ICD-10-CM | POA: Diagnosis not present

## 2017-02-24 DIAGNOSIS — Z886 Allergy status to analgesic agent status: Secondary | ICD-10-CM

## 2017-02-24 DIAGNOSIS — N83292 Other ovarian cyst, left side: Secondary | ICD-10-CM | POA: Diagnosis present

## 2017-02-24 DIAGNOSIS — R1032 Left lower quadrant pain: Secondary | ICD-10-CM | POA: Diagnosis present

## 2017-02-24 DIAGNOSIS — D352 Benign neoplasm of pituitary gland: Secondary | ICD-10-CM | POA: Diagnosis present

## 2017-02-24 DIAGNOSIS — N979 Female infertility, unspecified: Secondary | ICD-10-CM | POA: Diagnosis present

## 2017-02-24 DIAGNOSIS — N926 Irregular menstruation, unspecified: Secondary | ICD-10-CM | POA: Diagnosis present

## 2017-02-24 DIAGNOSIS — R10814 Left lower quadrant abdominal tenderness: Secondary | ICD-10-CM | POA: Diagnosis not present

## 2017-02-24 HISTORY — DX: Sepsis, unspecified organism: A41.9

## 2017-02-24 HISTORY — DX: Sepsis, unspecified organism: N39.0

## 2017-02-24 LAB — HIV ANTIBODY (ROUTINE TESTING W REFLEX): HIV Screen 4th Generation wRfx: NONREACTIVE

## 2017-02-24 LAB — COMPREHENSIVE METABOLIC PANEL
ALBUMIN: 3.9 g/dL (ref 3.5–5.0)
ALK PHOS: 60 U/L (ref 38–126)
ALT: 30 U/L (ref 14–54)
ANION GAP: 11 (ref 5–15)
AST: 42 U/L — ABNORMAL HIGH (ref 15–41)
BUN: 11 mg/dL (ref 6–20)
CALCIUM: 9.4 mg/dL (ref 8.9–10.3)
CO2: 22 mmol/L (ref 22–32)
Chloride: 103 mmol/L (ref 101–111)
Creatinine, Ser: 1.02 mg/dL — ABNORMAL HIGH (ref 0.44–1.00)
GFR calc non Af Amer: >60 mL/min (ref >60)
Glucose, Bld: 94 mg/dL (ref 65–99)
Potassium: 3.5 mmol/L (ref 3.5–5.1)
SODIUM: 136 mmol/L (ref 135–145)
TOTAL PROTEIN: 7.2 g/dL (ref 6.5–8.1)
Total Bilirubin: 0.5 mg/dL (ref 0.3–1.2)

## 2017-02-24 LAB — CBC
HEMATOCRIT: 34 % — AB (ref 36.0–46.0)
HEMOGLOBIN: 11.6 g/dL — AB (ref 12.0–15.0)
MCH: 29.3 pg (ref 26.0–34.0)
MCHC: 34.1 g/dL (ref 30.0–36.0)
MCV: 85.9 fL (ref 78.0–100.0)
Platelets: 230 10*3/uL (ref 150–400)
RBC: 3.96 MIL/uL (ref 3.87–5.11)
RDW: 11.6 % (ref 11.5–15.5)
WBC: 6.3 10*3/uL (ref 4.0–10.5)

## 2017-02-24 LAB — URINALYSIS, ROUTINE W REFLEX MICROSCOPIC
BACTERIA UA: NONE SEEN
BILIRUBIN URINE: NEGATIVE
GLUCOSE, UA: NEGATIVE mg/dL
KETONES UR: NEGATIVE mg/dL
NITRITE: NEGATIVE
PROTEIN: 30 mg/dL — AB
Specific Gravity, Urine: 1.011 (ref 1.005–1.030)
pH: 8 (ref 5.0–8.0)

## 2017-02-24 LAB — PARASITE EXAM SCREEN, BLOOD-W CONF TO LABCORP (NOT @ ARMC)

## 2017-02-24 LAB — I-STAT CG4 LACTIC ACID, ED
LACTIC ACID, VENOUS: 0.97 mmol/L (ref 0.5–1.9)
Lactic Acid, Venous: 3.39 mmol/L (ref 0.5–1.9)

## 2017-02-24 LAB — I-STAT TROPONIN, ED: Troponin i, poc: 0 ng/mL (ref 0.00–0.08)

## 2017-02-24 LAB — POCT PREGNANCY, URINE: PREG TEST UR: NEGATIVE

## 2017-02-24 LAB — LIPASE, BLOOD: Lipase: 31 U/L (ref 11–51)

## 2017-02-24 MED ORDER — ONDANSETRON HCL 4 MG PO TABS: 4.0000 mg | ORAL_TABLET | Freq: Four times a day (QID) | ORAL | Status: DC | PRN

## 2017-02-24 MED ORDER — ACETAMINOPHEN 650 MG RE SUPP
650.0000 mg | Freq: Four times a day (QID) | RECTAL | Status: DC | PRN
Start: 2017-02-24 — End: 2017-02-25

## 2017-02-24 MED ORDER — SODIUM CHLORIDE 0.9 % IV SOLN
INTRAVENOUS | Status: DC
  Administered 2017-02-24 – 2017-02-25 (×3): via INTRAVENOUS

## 2017-02-24 MED ORDER — SODIUM CHLORIDE 0.9 % IV BOLUS (SEPSIS)
1000.0000 mL | Freq: Once | INTRAVENOUS | Status: AC
  Administered 2017-02-24: 1000 mL via INTRAVENOUS

## 2017-02-24 MED ORDER — DEXTROSE 5 % IV SOLN
1.0000 g | INTRAVENOUS | Status: DC
  Administered 2017-02-25: 1 g via INTRAVENOUS
  Filled 2017-02-24: qty 10

## 2017-02-24 MED ORDER — SODIUM CHLORIDE 0.9 % IV BOLUS (SEPSIS)
500.0000 mL | Freq: Once | INTRAVENOUS | Status: AC
  Administered 2017-02-24: 500 mL via INTRAVENOUS

## 2017-02-24 MED ORDER — ONDANSETRON HCL 4 MG/2ML IJ SOLN: 4.0000 mg | Freq: Four times a day (QID) | INTRAMUSCULAR | Status: DC | PRN

## 2017-02-24 MED ORDER — ACETAMINOPHEN 325 MG PO TABS
650.0000 mg | ORAL_TABLET | Freq: Four times a day (QID) | ORAL | Status: DC | PRN
  Administered 2017-02-24 – 2017-02-25 (×3): 650 mg via ORAL
  Filled 2017-02-24 (×3): qty 2

## 2017-02-24 MED ORDER — DEXTROSE 5 % IV SOLN
2.0000 g | Freq: Once | INTRAVENOUS | Status: AC
  Administered 2017-02-24: 2 g via INTRAVENOUS
  Filled 2017-02-24: qty 2

## 2017-02-24 MED ORDER — ACETAMINOPHEN 500 MG PO TABS
1000.0000 mg | ORAL_TABLET | Freq: Once | ORAL | Status: AC
  Administered 2017-02-24: 1000 mg via ORAL
  Filled 2017-02-24: qty 2

## 2017-02-24 MED ORDER — ENOXAPARIN SODIUM 40 MG/0.4ML ~~LOC~~ SOLN
40.0000 mg | SUBCUTANEOUS | Status: DC
  Administered 2017-02-24 – 2017-02-25 (×2): 40 mg via SUBCUTANEOUS
  Filled 2017-02-24 (×3): qty 0.4

## 2017-02-24 MED ORDER — ZOLPIDEM TARTRATE 5 MG PO TABS
5.0000 mg | ORAL_TABLET | Freq: Once | ORAL | Status: AC
  Administered 2017-02-24: 5 mg via ORAL
  Filled 2017-02-24: qty 1

## 2017-02-24 MED ORDER — ONDANSETRON 4 MG PO TBDP
4.0000 mg | ORAL_TABLET | Freq: Once | ORAL | Status: AC | PRN
  Administered 2017-02-24: 4 mg via ORAL

## 2017-02-24 MED ORDER — ONDANSETRON 4 MG PO TBDP
ORAL_TABLET | ORAL | Status: AC
  Filled 2017-02-24: qty 1

## 2017-02-24 NOTE — Progress Notes (Signed)
Patient seen and examined at bedside, patient admitted after midnight, please see earlier detailed admission note by Etta Quill, DO. Briefly, patient presented with LLQ/left flank pain which has improved. Concern for UTI/pyelo. Ceftriaxone started. Cultures pending. Chest pain present on admission and resolved. Obtain pelvic ultrasound to assess left adnexa.   Cordelia Poche, MD [facility] 02/24/2017, 11:28 AM Pager: [phone]

## 2017-02-24 NOTE — H&P (Signed)
History and Physical    [name] ALP:[id number] DOB: [birth year] DOA: 02/24/2017  PCP: [facility]  Patient coming from: Home  I have personally briefly reviewed patient's old medical records in [place]  Chief Complaint: Abd pain  HPI: [name] is a 36 y.o. female with medical history significant of prolactinoma, typhoid fever.  Patient presents to the ED with c/o 1 week history of LLQ abd pain, worsening.  Worsening pain today with N/V.  Also reports central chest pain with radiation to R arm but this only with movement of R arm.  Has had cough for past 1.5 months.  Of note patient just spent 2 months in [place], [place].  Returned to [place] x3 weeks ago.  No hemoptysis, no weight loss, no known TB contacts.  ED Course: Tm 100.4.  Lactate 3.39, UA suggests UTI, CXR is neg.   Review of Systems: As per HPI otherwise 10 point review of systems negative.   Past Medical History:  Diagnosis Date  . Axillary mass    treated with doxy 05/2006  . Depression   . Female infertility    histosalpingogram showed left fallopian tube occlusion, right partially open.  . Irregular menses    followed by Dr Kalman Shan and [facility] infertility clinic  . Pituitary microadenoma with hyperprolactinemia ([place])   . Typhoid fever    hx of s/pabdominal  surgery for complication.  Marland Kitchen UTI (lower urinary tract infection)     Past Surgical History:  Procedure Laterality Date  . ABDOMINAL SURGERY     R/T typhoid fever 2002 and revision in 2004     reports that she has never smoked. She has never used smokeless tobacco. She reports that she drinks alcohol. She reports that she does not use drugs.  Allergies  Allergen Reactions  . Ibuprofen Other (See Comments)    Chest pain    Family History  Problem Relation Age of Onset  . Cancer Maternal Aunt        breast cancer at age 62yrs  . Diabetes Neg Hx      Prior to Admission medications   Medication Sig Start  Date End Date Taking? Authorizing Provider  acetaminophen (TYLENOL) 325 MG tablet Take 650 mg by mouth every 6 (six) hours as needed. Reported on 07/15/2015    [provider]  cabergoline (DOSTINEX) 0.5 MG tablet TAKE 1 TABLET(0.5 MG) on Mondays and 1-1/2 tablet on Thursdays 09/01/16   Elayne Snare, MD  fluticasone [facility]) 50 MCG/ACT nasal spray Place 1 spray into both nostrils daily. 10/10/16   Shary Decamp, PA-C  Prenatal Multivit-Min-Fe-FA (PRENATAL VITAMINS PO) Take by mouth. Reported on 07/15/2015    [provider]    Physical Exam: Vitals:   02/24/17 0107 02/24/17 0108 02/24/17 0209  BP: 135/64  124/68  Pulse: (!) 105  (!) 110  Resp: 20  (!) 27  Temp: 98.9 F (37.2 C)  (!) 100.4 F (38 C)  TempSrc: Oral  Oral  SpO2: 100%  100%  Weight:  74.4 kg (164 lb)   Height:  5\' 6"  (1.676 m)     Constitutional: NAD, calm, comfortable Eyes: PERRL, lids and conjunctivae normal ENMT: Mucous membranes are moist. Posterior pharynx clear of any exudate or lesions.Normal dentition.  Neck: normal, supple, no masses, no thyromegaly Respiratory: clear to auscultation bilaterally, no wheezing, no crackles. Normal respiratory effort. No accessory muscle use.  Cardiovascular: Regular rate and rhythm, no murmurs / rubs / gallops. No extremity  edema. 2+ pedal pulses. No carotid bruits.  Abdomen: no tenderness, no masses palpated. No hepatosplenomegaly. Bowel sounds positive.  Musculoskeletal: no clubbing / cyanosis. No joint deformity upper and lower extremities. Good ROM, no contractures. Normal muscle tone.  Skin: no rashes, lesions, ulcers. No induration Neurologic: CN 2-12 grossly intact. Sensation intact, DTR normal. Strength 5/5 in all 4.  Psychiatric: Normal judgment and insight. Alert and oriented x 3. Normal mood.    Labs on Admission: I have personally reviewed following labs and imaging studies  CBC:  Recent Labs Lab 02/24/17 0115  WBC 6.3  HGB 11.6*  HCT 34.0*  MCV  85.9  PLT 106   Basic Metabolic Panel:  Recent Labs Lab 02/24/17 0115  NA 136  K 3.5  CL 103  CO2 22  GLUCOSE 94  BUN 11  CREATININE 1.02*  CALCIUM 9.4   GFR: Estimated Creatinine Clearance: 78.6 mL/min (A) (by C-G formula based on SCr of 1.02 mg/dL (H)). Liver Function Tests:  Recent Labs Lab 02/24/17 0115  AST 42*  ALT 30  ALKPHOS 60  BILITOT 0.5  PROT 7.2  ALBUMIN 3.9    Recent Labs Lab 02/24/17 0115  LIPASE 31   No results for input(s): AMMONIA in the last 168 hours. Coagulation Profile: No results for input(s): INR, PROTIME in the last 168 hours. Cardiac Enzymes: No results for input(s): CKTOTAL, CKMB, CKMBINDEX, TROPONINI in the last 168 hours. BNP (last 3 results) No results for input(s): PROBNP in the last 8760 hours. HbA1C: No results for input(s): HGBA1C in the last 72 hours. CBG: No results for input(s): GLUCAP in the last 168 hours. Lipid Profile: No results for input(s): CHOL, HDL, LDLCALC, TRIG, CHOLHDL, LDLDIRECT in the last 72 hours. Thyroid Function Tests: No results for input(s): TSH, T4TOTAL, FREET4, T3FREE, THYROIDAB in the last 72 hours. Anemia Panel: No results for input(s): VITAMINB12, FOLATE, FERRITIN, TIBC, IRON, RETICCTPCT in the last 72 hours. Urine analysis:    Component Value Date/Time   COLORURINE STRAW (A) 02/24/2017 0127   APPEARANCEUR HAZY (A) 02/24/2017 0127   LABSPEC 1.011 02/24/2017 0127   PHURINE 8.0 02/24/2017 0127   GLUCOSEU NEGATIVE 02/24/2017 0127   HGBUR SMALL (A) 02/24/2017 0127   HGBUR negative 07/29/2006 1111   BILIRUBINUR NEGATIVE 02/24/2017 0127   KETONESUR NEGATIVE 02/24/2017 0127   PROTEINUR 30 (A) 02/24/2017 0127   UROBILINOGEN 0.2 07/29/2015 0845   NITRITE NEGATIVE 02/24/2017 0127   LEUKOCYTESUR MODERATE (A) 02/24/2017 0127    Radiological Exams on Admission: Dg Chest 2 View  Result Date: 02/24/2017 CLINICAL DATA:  Central chest pain. EXAM: CHEST  2 VIEW COMPARISON:  Radiograph 05/17/2015  FINDINGS: The cardiomediastinal contours are normal. The lungs are clear. Pulmonary vasculature is normal. No consolidation, pleural effusion, or pneumothorax. No acute osseous abnormalities are seen. IMPRESSION: No acute pulmonary process. Electronically Signed   By: Jeb Levering M.D.   On: 02/24/2017 02:00    EKG: Independently reviewed.  Assessment/Plan Active Problems:   Sepsis secondary to UTI ([place])    1. Sepsis - most likely secondary to UTI 1. DDX includes pulmonary infection, PE, malaria 2. Empiric rocephin 3. UCx pending 4. IVF 5. Tylenol PRN fever 6. Checking malaria screen  DVT prophylaxis: Lovenox Code Status: Full Family Communication: No family in room Disposition Plan: Home after admit Consults called: None Admission status: Place in obs   [place], [place] [facility] Pager [phone]  If 7AM-7PM, please contact day team taking care of patient [email or web address] Password [email or web address]  02/24/2017, 3:44 AM

## 2017-02-24 NOTE — ED Triage Notes (Signed)
Pt to ED c/o severe LLQ abd pain x 1 week, worsening today, with N/V. Patient also reports central CP radiating to R arm (only upon picking her R arm up), worsening today as well. Pt states both pain sites just began hurting a lot about 11:30pm tonight. Pt endorses SOB and worsening pain upon taking a deep breath. She adds that she has been having chills off and on for a week.

## 2017-02-24 NOTE — ED Provider Notes (Signed)
[facility] DEPT Provider Note   CSN: [id number] Arrival date & time: 02/24/17  0055     History   Chief Complaint Chief Complaint  Patient presents with  . Chest Pain  . Abdominal Pain    HPI [name] is a 36 y.o. female.  HPI   Chills started around 11PM, developed nausea, vomiting x4 total, zofran in triage helped a little bit  Left lower back pain and left lower abdominal pain, noticed it yesterday when washing dishes.  Aching pain, cramping, dysuria has been present over last several days   Chest pain burning right side, started with fever, coughing for about 1.5 months, thick white sputum, has been off and on  Went to [place], 2 months there, back for 3 weeks now, no hemoptysis, no weight loss, no known TB contacts  Past Medical History:  Diagnosis Date  . Axillary mass    treated with doxy 05/2006  . Depression   . Female infertility    histosalpingogram showed left fallopian tube occlusion, right partially open.  . Irregular menses    followed by Dr Kalman Shan and [facility] infertility clinic  . Pituitary microadenoma with hyperprolactinemia ([place])   . Typhoid fever    hx of s/pabdominal  surgery for complication.  Marland Kitchen UTI (lower urinary tract infection)     Patient Active Problem List   Diagnosis Date Noted  . Sepsis secondary to UTI ([place]) 02/24/2017  . Elevated fasting prolactin level of 167 ng/ml 10/11/2014  . Worsening headaches 10/11/2014  . Preventative health care 12/16/2012  . Acute low back pain due to trauma 12/16/2012  . BOILS, RECURRENT 08/27/2009  . ABDOMINAL PREGNANCY WITH INTRAUTERINE PREGNANCY 12/29/2007  . DEPRESSION 12/28/2007  . BLEEDING GUMS 12/28/2007  . UNSPECIFIED SUBJECTIVE VISUAL DISTURBANCE 07/19/2007  . EPISTAXIS 07/19/2007  . HEMOPTYSIS 07/19/2007  . ABDOMINAL PAIN, CHRONIC 07/29/2006  . Typhoid fever 06/03/2006  . IRREGULAR MENSES 06/03/2006  . Infertility, female 06/03/2006    Past Surgical History:  Procedure  Laterality Date  . ABDOMINAL SURGERY     R/T typhoid fever 2002 and revision in 2004    OB History    Gravida Para Term Preterm AB Living   3 2 2     2    SAB TAB Ectopic Multiple Live Births                   Home Medications    Prior to Admission medications   Medication Sig Start Date End Date Taking? Authorizing Provider  acetaminophen (TYLENOL) 325 MG tablet Take 650 mg by mouth every 6 (six) hours as needed for mild pain. Reported on 07/15/2015   Yes [provider]  Biotin w/ Vitamins C & E (HAIR/SKIN/NAILS PO) Take 1 tablet by mouth once a week.   Yes [provider]    Family History Family History  Problem Relation Age of Onset  . Cancer Maternal Aunt        breast cancer at age 14yrs  . Diabetes Neg Hx     Social History Social History  Substance Use Topics  . Smoking status: Never Smoker  . Smokeless tobacco: Never Used  . Alcohol use 0.0 oz/week     Comment: Occasionally not with pregnancy     Allergies   Ibuprofen   Review of Systems Review of Systems  Constitutional: Positive for appetite change, chills, fatigue and fever.  HENT: Positive for sore throat. Negative for congestion.   Eyes: Negative for visual disturbance.  Respiratory: Positive for cough. Negative for shortness of breath.   Cardiovascular: Positive for chest pain. Negative for leg swelling.  Gastrointestinal: Positive for abdominal pain, nausea and vomiting. Negative for constipation and diarrhea.  Genitourinary: Positive for dysuria and flank pain. Negative for difficulty urinating.  Musculoskeletal: Positive for back pain. Negative for neck pain.  Skin: Negative for rash.  Neurological: Positive for headaches. Negative for syncope.     Physical Exam Updated Vital Signs BP 111/60 (BP Location: Right Arm)   Pulse 92   Temp 99.7 F (37.6 C) (Oral)   Resp 18   Ht 5\' 6"  (1.676 m)   Wt 74.4 kg (164 lb)   LMP 02/04/2017 (Exact Date)   SpO2 100%   BMI 26.47  kg/m   Physical Exam  Constitutional: She is oriented to person, place, and time. She appears well-developed and well-nourished. No distress.  HENT:  Head: Normocephalic and atraumatic.  Eyes: Conjunctivae and EOM are normal.  Neck: Normal range of motion.  Cardiovascular: Normal rate, regular rhythm, normal heart sounds and intact distal pulses.  Exam reveals no gallop and no friction rub.   No murmur heard. Pulmonary/Chest: Effort normal and breath sounds normal. No respiratory distress. She has no wheezes. She has no rales.  Abdominal: Soft. She exhibits no distension. There is tenderness (suprapubic). There is no guarding.  Left CVA   Musculoskeletal: She exhibits no edema or tenderness.  Neurological: She is alert and oriented to person, place, and time.  Skin: Skin is warm and dry. No rash noted. She is not diaphoretic. No erythema.  Nursing note and vitals reviewed.    ED Treatments / Results  Labs (all labs ordered are listed, but only abnormal results are displayed) Labs Reviewed  COMPREHENSIVE METABOLIC PANEL - Abnormal; Notable for the following:       Result Value   Creatinine, Ser 1.02 (*)    AST 42 (*)    All other components within normal limits  CBC - Abnormal; Notable for the following:    Hemoglobin 11.6 (*)    HCT 34.0 (*)    All other components within normal limits  URINALYSIS, ROUTINE W REFLEX MICROSCOPIC - Abnormal; Notable for the following:    Color, Urine STRAW (*)    APPearance HAZY (*)    Hgb urine dipstick SMALL (*)    Protein, ur 30 (*)    Leukocytes, UA MODERATE (*)    Squamous Epithelial / LPF 6-30 (*)    Non Squamous Epithelial 0-5 (*)    All other components within normal limits  I-STAT CG4 LACTIC ACID, ED - Abnormal; Notable for the following:    Lactic Acid, Venous 3.39 (*)    All other components within normal limits  CULTURE, BLOOD (ROUTINE X 2)  CULTURE, BLOOD (ROUTINE X 2)  URINE CULTURE  LIPASE, BLOOD  PARASITE EXAM SCREEN,  BLOOD-W CONF TO [facility] (NOT @ [facility])  HIV ANTIBODY (ROUTINE TESTING)  PARASITE EXAM, BLOOD  POC URINE PREG, ED  I-STAT TROPONIN, ED  I-STAT CG4 LACTIC ACID, ED  POCT PREGNANCY, URINE    EKG  EKG Interpretation  Date/Time:  Wednesday February 24 2017 01:00:50 EDT Ventricular Rate:  110 PR Interval:  128 QRS Duration: 74 QT Interval:  310 QTC Calculation: 419 R Axis:   79 Text Interpretation:  Sinus tachycardia Otherwise normal ECG Since prior ECG, rate has increased Confirmed by Gareth Morgan [id number]) on 02/24/2017 2:09:25 AM Also confirmed by Gareth Morgan [phone]), editor Oswaldo Milian, [place] ([id number])  on 02/24/2017 7:13:19 AM       Radiology Dg Chest 2 View  Result Date: 02/24/2017 CLINICAL DATA:  Central chest pain. EXAM: CHEST  2 VIEW COMPARISON:  Radiograph 05/17/2015 FINDINGS: The cardiomediastinal contours are normal. The lungs are clear. Pulmonary vasculature is normal. No consolidation, pleural effusion, or pneumothorax. No acute osseous abnormalities are seen. IMPRESSION: No acute pulmonary process. Electronically Signed   By: Jeb Levering M.D.   On: 02/24/2017 02:00   US Renal  Result Date: 02/24/2017 CLINICAL DATA:  36 y/o  F; pyelonephritis. EXAM: RENAL / URINARY TRACT ULTRASOUND COMPLETE COMPARISON:  07/13/2008 abdominal ultrasound FINDINGS: Right Kidney: Length: 11.0 cm. Echogenicity within normal limits. No mass or hydronephrosis visualized. Left Kidney: Length: 11.8 cm. Echogenicity within normal limits. No mass or hydronephrosis visualized. Bladder: Appears normal for degree of bladder distention. Bilateral ureteral jets seen. IMPRESSION: Normal renal ultrasound. Electronically Signed   By: Kristine Garbe M.D.   On: 02/24/2017 03:57    Procedures Procedures (including critical care time)  Medications Ordered in ED Medications  cefTRIAXone (ROCEPHIN) 1 g in dextrose 5 % 50 mL IVPB (not administered)  acetaminophen (TYLENOL) tablet 650 mg (not  administered)    Or  acetaminophen (TYLENOL) suppository 650 mg (not administered)  ondansetron (ZOFRAN) tablet 4 mg (not administered)    Or  ondansetron (ZOFRAN) injection 4 mg (not administered)  enoxaparin (LOVENOX) injection 40 mg (40 mg Subcutaneous Given 02/24/17 1035)  0.9 %  sodium chloride infusion ( Intravenous New Bag/Given 02/24/17 0655)  ondansetron (ZOFRAN-ODT) disintegrating tablet 4 mg (4 mg Oral Given 02/24/17 0119)  cefTRIAXone (ROCEPHIN) 2 g in dextrose 5 % 50 mL IVPB (0 g Intravenous Stopped 02/24/17 0426)  sodium chloride 0.9 % bolus 1,000 mL (1,000 mLs Intravenous New Bag/Given 02/24/17 0434)  sodium chloride 0.9 % bolus 1,000 mL (0 mLs Intravenous Stopped 02/24/17 0434)  sodium chloride 0.9 % bolus 500 mL (500 mLs Intravenous New Bag/Given 02/24/17 6834)  acetaminophen (TYLENOL) tablet 1,000 mg (1,000 mg Oral Given 02/24/17 0434)     Initial Impression / Assessment and Plan / ED Course  I have reviewed the triage vital signs and the nursing notes.  Pertinent labs & imaging results that were available during my care of the patient were reviewed by me and considered in my medical decision making (see chart for details).     35yo female with history of prolactinoma presents with concern for fever, chills, nausea, vomiting, flank pain, dysuria and chest pain.  Reports cough for 1.5 mos--chest XR negative, no hemoptysis, no weight loss, no known contacts with TB and low suspicion. Abdominal exam benign, doubt appendicitis, cholecystitis or diverticulitis.  Cp in setting of fever, chills, abdominal/flank symptoms, troponin negative, EKG with sinus tachycardia, doubt ACS/PE.    Urinalysis shows concern for infection, and in setting of dysuria, nausea, omiting, suprapubic and flank pain feel pyelonephritis is most likely etiology of pt's symptoms and sepsis. Lactic acid elevated, given fluids and rocephin.  Sent parasitology for malaria screen given recent travel.  Will admit for further  care.   Final Clinical Impressions(s) / ED Diagnoses   Final diagnoses:  Sepsis, due to unspecified organism [facility])  Urinary tract infection without hematuria, site unspecified    New Prescriptions Current Discharge Medication List       Gareth Morgan, MD 02/24/17 1137

## 2017-02-24 NOTE — ED Notes (Signed)
MD Vanita Panda and RN [facility] informed of I-stat lactic acid resulting to be 3.39

## 2017-02-25 ENCOUNTER — Inpatient Hospital Stay (HOSPITAL_COMMUNITY): Payer: Medicaid Other

## 2017-02-25 DIAGNOSIS — N10 Acute tubulo-interstitial nephritis: Secondary | ICD-10-CM | POA: Diagnosis present

## 2017-02-25 DIAGNOSIS — N83202 Unspecified ovarian cyst, left side: Secondary | ICD-10-CM | POA: Diagnosis present

## 2017-02-25 LAB — URINE CULTURE

## 2017-02-25 MED ORDER — ONDANSETRON HCL 4 MG PO TABS: 4.0000 mg | ORAL_TABLET | Freq: Four times a day (QID) | ORAL | 0 refills | Status: DC | PRN

## 2017-02-25 MED ORDER — CEFPODOXIME PROXETIL 200 MG PO TABS: 200.0000 mg | ORAL_TABLET | Freq: Two times a day (BID) | ORAL | 0 refills | Status: AC

## 2017-02-25 NOTE — Progress Notes (Signed)
Pt has DC orders in. Her ride will be able to pick her up between 1730-1800.

## 2017-02-25 NOTE — Discharge Instructions (Signed)
[name],.  You were in the hospital and treated for a urinary tract infection with antibiotics. You were also found to have an ovarian cyst. Please follow-up with your primary care physician.

## 2017-02-25 NOTE — Discharge Summary (Signed)
Physician Discharge Summary  [name] JME:[id number] DOB: Apr 15, 1981 DOA: 02/24/2017  PCP: [facility], [facility]  Admit date: 02/24/2017 Discharge date: 02/25/2017  Admitted From: Home Disposition: Home  Recommendations for Outpatient Follow-up:  1. Follow up with PCP in 1 week 2. Please follow up on the following pending results: Urine culture, blood culture  Home Health: None Equipment/Devices: None  Discharge Condition: Stable CODE STATUS: Full code Diet recommendation: Regular   Brief/Interim Summary:  Admission HPI written by Etta Quill, DO   Chief Complaint: Abd pain  HPI: [name] is a 36 y.o. female with medical history significant of prolactinoma, typhoid fever.  Patient presents to the ED with c/o 1 week history of LLQ abd pain, worsening.  Worsening pain today with N/V.  Also reports central chest pain with radiation to R arm but this only with movement of R arm.  Has had cough for past 1.5 months.  Of note patient just spent 2 [place] in [place].  Returned to [place] x3 weeks ago.  No hemoptysis, no weight loss, no known TB contacts.  ED Course: Tm 100.4.  Lactate 3.39, UA suggests UTI, CXR is neg.   Hospital course:  Sepsis Febrile with tachycardia on admission with urinary source. Empiric coverage with ceftriaxone. Lactic acid improved with IV fluids. Blood culture no growth to date. Urine culture pending. Symptoms improved. Afebrile. Vantin at discharge for 14 day total course.  Pyelonephritis Urine culture pending. Ceftriaxone empirically and switched to Vantin at discharge. Blood cultures no growth to date.  Ovarian cyst Cause of patient's LLQ tenderness, and nausea/vomiting. Worsened secondary to patient's upcoming period. conservative management.  Discharge Diagnoses:  Principal Problem:   Sepsis secondary to UTI [facility]) Active Problems:   Acute pyelonephritis   Ovarian cyst, left    Discharge  Instructions  Discharge Instructions    Call MD for:  difficulty breathing, headache or visual disturbances    Complete by:  As directed    Call MD for:  persistant dizziness or light-headedness    Complete by:  As directed    Call MD for:  severe uncontrolled pain    Complete by:  As directed    Call MD for:  temperature >100.4    Complete by:  As directed      Allergies as of 02/25/2017      Reactions   Ibuprofen Other (See Comments)   Chest pain      Medication List    TAKE these medications   acetaminophen 325 MG tablet Commonly known as:  TYLENOL Take 650 mg by mouth every 6 (six) hours as needed for mild pain. Reported on 07/15/2015   cefpodoxime 200 MG tablet Commonly known as:  VANTIN Take 1 tablet (200 mg total) by mouth 2 (two) times daily.   HAIR/SKIN/NAILS PO Take 1 tablet by mouth once a week.   ondansetron 4 MG tablet Commonly known as:  ZOFRAN Take 1 tablet (4 mg total) by mouth every 6 (six) hours as needed for nausea.            Discharge Care Instructions        Start     Ordered   02/26/17 0000  cefpodoxime (VANTIN) 200 MG tablet  2 times daily     02/25/17 1406   02/25/17 0000  ondansetron (ZOFRAN) 4 MG tablet  Every 6 hours PRN     02/25/17 1406   02/25/17 0000  Call MD for:  temperature >100.4  02/25/17 1406   02/25/17 0000  Call MD for:  severe uncontrolled pain     02/25/17 1406   02/25/17 0000  Call MD for:  persistant dizziness or light-headedness     02/25/17 1406   02/25/17 0000  Call MD for:  difficulty breathing, headache or visual disturbances     02/25/17 1406      Allergies  Allergen Reactions  . Ibuprofen Other (See Comments)    Chest pain    Consultations:  None   Procedures/Studies: Dg Chest 2 View  Result Date: 02/24/2017 CLINICAL DATA:  Central chest pain. EXAM: CHEST  2 VIEW COMPARISON:  Radiograph 05/17/2015 FINDINGS: The cardiomediastinal contours are normal. The lungs are clear. Pulmonary vasculature  is normal. No consolidation, pleural effusion, or pneumothorax. No acute osseous abnormalities are seen. IMPRESSION: No acute pulmonary process. Electronically Signed   By: Jeb Levering M.D.   On: 02/24/2017 02:00   US Pelvis Transvanginal Non-ob (tv Only)  Result Date: 02/25/2017 CLINICAL DATA:  Initial evaluation for left lower quadrant pain for 1 year. Irregular menses. EXAM: TRANSABDOMINAL AND TRANSVAGINAL ULTRASOUND OF PELVIS TECHNIQUE: Both transabdominal and transvaginal ultrasound examinations of the pelvis were performed. Transabdominal technique was performed for global imaging of the pelvis including uterus, ovaries, adnexal regions, and pelvic cul-de-sac. It was necessary to proceed with endovaginal exam following the transabdominal exam to visualize the uterus and ovaries. COMPARISON:  Prior ultrasound from 06/14/2015. FINDINGS: Uterus Measurements: 10.5 x 5.2 x 6.4 cm. No fibroids or other mass visualized. Endometrium Thickness: 11.8 mm.  No focal abnormality visualized. Right ovary Measurements: 4.8 x 1.8 x 3.0 cm. Normal appearance/no adnexal mass. 1.4 x 1.3 x 1.3 cm mildly complex hypoechoic lesion, likely a complex/degenerating physiologic cyst. Left ovary Measurements: 4.1 x 3.2 x 3.1 cm. Normal appearance/no adnexal mass. 3.2 x 2.0 x 2.4 simple anechoic cyst, most consistent with a normal physiologic cyst. Other findings No abnormal free fluid. IMPRESSION: 1. 3.2 cm simple left ovarian cyst, most consistent with a normal physiologic cyst. Given size and appearance, no follow-up imaging is recommended regarding this lesion. This recommendation follows the consensus statement: Management of Asymptomatic Ovarian and Other Adnexal Cysts Imaged at [place]: Society of Radiologists in [place]. Radiology 2010; [phone]. 2. No other acute abnormality within the pelvis. Electronically Signed   By: Jeannine Boga M.D.   On: 02/25/2017 13:30   US Pelvis  (transabdominal Only)  Result Date: 02/25/2017 CLINICAL DATA:  Initial evaluation for left lower quadrant pain for 1 year. Irregular menses. EXAM: TRANSABDOMINAL AND TRANSVAGINAL ULTRASOUND OF PELVIS TECHNIQUE: Both transabdominal and transvaginal ultrasound examinations of the pelvis were performed. Transabdominal technique was performed for global imaging of the pelvis including uterus, ovaries, adnexal regions, and pelvic cul-de-sac. It was necessary to proceed with endovaginal exam following the transabdominal exam to visualize the uterus and ovaries. COMPARISON:  Prior ultrasound from 06/14/2015. FINDINGS: Uterus Measurements: 10.5 x 5.2 x 6.4 cm. No fibroids or other mass visualized. Endometrium Thickness: 11.8 mm.  No focal abnormality visualized. Right ovary Measurements: 4.8 x 1.8 x 3.0 cm. Normal appearance/no adnexal mass. 1.4 x 1.3 x 1.3 cm mildly complex hypoechoic lesion, likely a complex/degenerating physiologic cyst. Left ovary Measurements: 4.1 x 3.2 x 3.1 cm. Normal appearance/no adnexal mass. 3.2 x 2.0 x 2.4 simple anechoic cyst, most consistent with a normal physiologic cyst. Other findings No abnormal free fluid. IMPRESSION: 1. 3.2 cm simple left ovarian cyst, most consistent with a normal physiologic cyst. Given size and  appearance, no follow-up imaging is recommended regarding this lesion. This recommendation follows the consensus statement: Management of Asymptomatic Ovarian and Other Adnexal Cysts Imaged at [place]: Society of Radiologists in [place]. Radiology 2010; [phone]. 2. No other acute abnormality within the pelvis. Electronically Signed   By: Jeannine Boga M.D.   On: 02/25/2017 13:30   US Renal  Result Date: 02/24/2017 CLINICAL DATA:  36 y/o  F; pyelonephritis. EXAM: RENAL / URINARY TRACT ULTRASOUND COMPLETE COMPARISON:  07/13/2008 abdominal ultrasound FINDINGS: Right Kidney: Length: 11.0 cm. Echogenicity within normal limits. No mass or  hydronephrosis visualized. Left Kidney: Length: 11.8 cm. Echogenicity within normal limits. No mass or hydronephrosis visualized. Bladder: Appears normal for degree of bladder distention. Bilateral ureteral jets seen. IMPRESSION: Normal renal ultrasound. Electronically Signed   By: Kristine Garbe M.D.   On: 02/24/2017 03:57     Subjective: Abdominal pain improving.  Discharge Exam: Vitals:   02/24/17 2100 02/25/17 0641  BP: 108/60 109/61  Pulse: 76 80  Resp: 16 16  Temp: 98.8 F (37.1 C) 98.9 F (37.2 C)  SpO2: 100% 100%   Vitals:   02/24/17 0509 02/24/17 1537 02/24/17 2100 02/25/17 0641  BP: 111/60 (!) 102/57 108/60 109/61  Pulse: 92 73 76 80  Resp: 18 16 16 16   Temp: 99.7 F (37.6 C) 98.4 F (36.9 C) 98.8 F (37.1 C) 98.9 F (37.2 C)  TempSrc: Oral Oral Oral Oral  SpO2: 100% 100% 100% 100%  Weight:      Height:        General: Pt is alert, awake, not in acute distress Cardiovascular: RRR, S1/S2 +, no rubs, no gallops Respiratory: CTA bilaterally, no wheezing, no rhonchi Abdominal: Soft, mild left lower quadrant tenderness, ND, bowel sounds + Extremities: no edema, no cyanosis    The results of significant diagnostics from this hospitalization (including imaging, microbiology, ancillary and laboratory) are listed below for reference.     Microbiology: Recent Results (from the past 240 hour(s))  Blood culture (routine x 2)     Status: None (Preliminary result)   Collection Time: 02/24/17  2:50 AM  Result Value Ref Range Status   Specimen Description BLOOD RIGHT WRIST  Final   Special Requests   Final    BOTTLES DRAWN AEROBIC AND ANAEROBIC Blood Culture adequate volume   Culture NO GROWTH 1 DAY  Final   Report Status PENDING  Incomplete  Blood culture (routine x 2)     Status: None (Preliminary result)   Collection Time: 02/24/17  3:05 AM  Result Value Ref Range Status   Specimen Description BLOOD LEFT FOREARM  Final   Special Requests   Final     BOTTLES DRAWN AEROBIC AND ANAEROBIC Blood Culture adequate volume   Culture NO GROWTH 1 DAY  Final   Report Status PENDING  Incomplete     Labs: BNP (last 3 results) No results for input(s): BNP in the last 8760 hours. Basic Metabolic Panel:  Recent Labs Lab 02/24/17 0115  NA 136  K 3.5  CL 103  CO2 22  GLUCOSE 94  BUN 11  CREATININE 1.02*  CALCIUM 9.4   Liver Function Tests:  Recent Labs Lab 02/24/17 0115  AST 42*  ALT 30  ALKPHOS 60  BILITOT 0.5  PROT 7.2  ALBUMIN 3.9    Recent Labs Lab 02/24/17 0115  LIPASE 31   No results for input(s): AMMONIA in the last 168 hours. CBC:  Recent Labs Lab 02/24/17 0115  WBC  6.3  HGB 11.6*  HCT 34.0*  MCV 85.9  PLT 230   Cardiac Enzymes: No results for input(s): CKTOTAL, CKMB, CKMBINDEX, TROPONINI in the last 168 hours. BNP: Invalid input(s): POCBNP CBG: No results for input(s): GLUCAP in the last 168 hours. D-Dimer No results for input(s): DDIMER in the last 72 hours. Hgb A1c No results for input(s): HGBA1C in the last 72 hours. Lipid Profile No results for input(s): CHOL, HDL, LDLCALC, TRIG, CHOLHDL, LDLDIRECT in the last 72 hours. Thyroid function studies No results for input(s): TSH, T4TOTAL, T3FREE, THYROIDAB in the last 72 hours.  Invalid input(s): FREET3 Anemia work up No results for input(s): VITAMINB12, FOLATE, FERRITIN, TIBC, IRON, RETICCTPCT in the last 72 hours. Urinalysis    Component Value Date/Time   COLORURINE STRAW (A) 02/24/2017 0127   APPEARANCEUR HAZY (A) 02/24/2017 0127   LABSPEC 1.011 02/24/2017 0127   PHURINE 8.0 02/24/2017 0127   GLUCOSEU NEGATIVE 02/24/2017 0127   HGBUR SMALL (A) 02/24/2017 0127   HGBUR negative 07/29/2006 1111   BILIRUBINUR NEGATIVE 02/24/2017 0127   KETONESUR NEGATIVE 02/24/2017 0127   PROTEINUR 30 (A) 02/24/2017 0127   UROBILINOGEN 0.2 07/29/2015 0845   NITRITE NEGATIVE 02/24/2017 0127   LEUKOCYTESUR MODERATE (A) 02/24/2017 0127   Sepsis Labs Invalid  input(s): PROCALCITONIN,  WBC,  LACTICIDVEN Microbiology Recent Results (from the past 240 hour(s))  Blood culture (routine x 2)     Status: None (Preliminary result)   Collection Time: 02/24/17  2:50 AM  Result Value Ref Range Status   Specimen Description BLOOD RIGHT WRIST  Final   Special Requests   Final    BOTTLES DRAWN AEROBIC AND ANAEROBIC Blood Culture adequate volume   Culture NO GROWTH 1 DAY  Final   Report Status PENDING  Incomplete  Blood culture (routine x 2)     Status: None (Preliminary result)   Collection Time: 02/24/17  3:05 AM  Result Value Ref Range Status   Specimen Description BLOOD LEFT FOREARM  Final   Special Requests   Final    BOTTLES DRAWN AEROBIC AND ANAEROBIC Blood Culture adequate volume   Culture NO GROWTH 1 DAY  Final   Report Status PENDING  Incomplete     SIGNED:   Cordelia Poche, MD [facility] 02/25/2017, 2:06 PM Pager [phone]  If 7PM-7AM, please contact night-coverage [email or web address] Password [email or web address]

## 2017-02-26 LAB — PARASITE EXAM, BLOOD

## 2017-03-01 ENCOUNTER — Ambulatory Visit: Payer: Medicaid Other | Admitting: Neurology

## 2017-03-01 LAB — CULTURE, BLOOD (ROUTINE X 2)
CULTURE: NO GROWTH
Culture: NO GROWTH
SPECIAL REQUESTS: ADEQUATE
Special Requests: ADEQUATE

## 2017-07-12 ENCOUNTER — Encounter: Payer: Self-pay | Admitting: Radiology

## 2017-07-12 ENCOUNTER — Telehealth: Payer: Self-pay | Admitting: Radiology

## 2017-07-12 NOTE — Telephone Encounter (Signed)
Left message on the cell phone voicemail to call [email or web address] to schedule appointment.

## 2017-07-20 ENCOUNTER — Ambulatory Visit (INDEPENDENT_AMBULATORY_CARE_PROVIDER_SITE_OTHER): Payer: Medicaid Other | Admitting: Obstetrics & Gynecology

## 2017-07-20 ENCOUNTER — Other Ambulatory Visit: Payer: Self-pay | Admitting: Obstetrics & Gynecology

## 2017-07-20 ENCOUNTER — Encounter: Payer: Self-pay | Admitting: Obstetrics & Gynecology

## 2017-07-20 ENCOUNTER — Other Ambulatory Visit (HOSPITAL_COMMUNITY)
Admission: RE | Admit: 2017-07-20 | Discharge: 2017-07-20 | Disposition: A | Discharge status: Home / Self Care | Payer: Medicaid Other | Source: Ambulatory Visit | Attending: Obstetrics & Gynecology | Admitting: Obstetrics & Gynecology

## 2017-07-20 VITALS — BP 108/73 | HR 74 | Wt 170.8 lb

## 2017-07-20 DIAGNOSIS — Z01419 Encounter for gynecological examination (general) (routine) without abnormal findings: Secondary | ICD-10-CM | POA: Diagnosis not present

## 2017-07-20 DIAGNOSIS — N941 Unspecified dyspareunia: Secondary | ICD-10-CM | POA: Insufficient documentation

## 2017-07-20 DIAGNOSIS — N643 Galactorrhea not associated with childbirth: Secondary | ICD-10-CM

## 2017-07-20 DIAGNOSIS — Z0000 Encounter for general adult medical examination without abnormal findings: Secondary | ICD-10-CM | POA: Diagnosis not present

## 2017-07-20 DIAGNOSIS — E229 Hyperfunction of pituitary gland, unspecified: Secondary | ICD-10-CM

## 2017-07-20 DIAGNOSIS — D352 Benign neoplasm of pituitary gland: Secondary | ICD-10-CM | POA: Insufficient documentation

## 2017-07-20 NOTE — Progress Notes (Signed)
GYNECOLOGY ANNUAL PREVENTATIVE CARE ENCOUNTER NOTE  Subjective:   [name] is a 37 y.o. G48P2002 female here for a routine annual gynecologic exam.  Current complaints: continued galactorrhea in the setting of known prolactinoma for which she wants recheck of levels today and mammogram to make sure her breasts are normal. Also reports new onset of dyspareunia for past few months.  Dyspareunia occurs with penetration and is present in every position. Feels adequately lubricated.   Denies abnormal vaginal bleeding, discharge or other gynecologic concerns.    Gynecologic History No LMP recorded. Contraception: condoms Last Pap: 1 to 2 years ago as per patient. Results were: normal  Obstetric History OB History  Gravida Para Term Preterm AB Living  2 2 2     2   SAB TAB Ectopic Multiple Live Births               # Outcome Date GA Lbr Len/2nd Weight Sex Delivery Anes PTL Lv  2 Term           1 Term               Past Medical History:  Diagnosis Date  . Axillary mass    treated with doxy 05/2006  . DEPRESSION 12/28/2007   Qualifier: Diagnosis of  By: Ditzler RN, Debra    . Female infertility    histosalpingogram showed left fallopian tube occlusion, right partially open.  . Irregular menses    followed by Dr Kalman Shan and [facility] infertility clinic  . 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 Sepsis secondary to UTI ([place]) 02/24/2017  . Typhoid fever    hx of s/pabdominal  surgery for complication.  Marland Kitchen UTI (lower urinary tract infection)     Past Surgical History:  Procedure Laterality Date  . ABDOMINAL SURGERY     R/T typhoid fever 2002 and revision in 2004    Current Outpatient Medications on File Prior to Visit  Medication Sig Dispense Refill  . acetaminophen (TYLENOL) 325 MG tablet Take 650 mg by mouth every 6 (six) hours as needed for mild pain. Reported on 07/15/2015    . Biotin w/ Vitamins C & E (HAIR/SKIN/NAILS PO) Take 1 tablet by mouth once a  week.    . ondansetron (ZOFRAN) 4 MG tablet Take 1 tablet (4 mg total) by mouth every 6 (six) hours as needed for nausea. (Patient not taking: Reported on 07/20/2017) 20 tablet 0   No current facility-administered medications on file prior to visit.     Allergies  Allergen Reactions  . Ibuprofen Other (See Comments)    Chest pain    Social History   Socioeconomic History  . Marital status: Married    Spouse name: Not on file  . Number of children: Not on file  . Years of education: Not on file  . Highest education level: Not on file  Social Needs  . Financial resource strain: Not on file  . Food insecurity - worry: Not on file  . Food insecurity - inability: Not on file  . Transportation needs - medical: Not on file  . Transportation needs - non-medical: Not on file  Occupational History  . Not on file  Tobacco Use  . Smoking status: Never Smoker  . Smokeless tobacco: Never Used  Substance and Sexual Activity  . Alcohol use: Yes    Alcohol/week: 0.0 oz    Comment: Occasionally not with pregnancy  . Drug use: No  . Sexual activity: Yes  Partners: Male    Birth control/protection: None    Comment: Occasional condoms  Other Topics Concern  . Not on file  Social History Narrative   Financial assistance approved for 100% discount at [facility] and has [facility] card, Bonna Gains Aug 02, 2009.   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    Family History  Problem Relation Age of Onset  . Cancer Maternal Aunt        breast cancer at age 50yrs  . Diabetes Neg Hx     The following portions of the patient's history were reviewed and updated as appropriate: allergies, current medications, past family history, past medical history, past social history, past surgical history and problem list.  Review of Systems Pertinent items noted in HPI and remainder of comprehensive ROS otherwise negative.   Objective:  BP 108/73   Pulse 74   Wt 170 lb 12.8 oz (77.5 kg)   BMI 27.57 kg/m    CONSTITUTIONAL: Well-developed, well-nourished female in no acute distress.  HENT:  Normocephalic, atraumatic, External right and left ear normal. Oropharynx is clear and moist EYES: Conjunctivae and EOM are normal. Pupils are equal, round, and reactive to light. No scleral icterus.  NECK: Normal range of motion, supple, no masses.  Normal thyroid.  SKIN: Skin is warm and dry. No rash noted. Not diaphoretic. No erythema. No pallor. NEUROLOGIC: Alert and oriented to person, place, and time. Normal reflexes, muscle tone coordination. No cranial nerve deficit noted. PSYCHIATRIC: Normal mood and affect. Normal behavior. Normal judgment and thought content. CARDIOVASCULAR: Normal heart rate noted, regular rhythm RESPIRATORY: Clear to auscultation bilaterally. Effort and breath sounds normal, no problems with respiration noted. BREASTS: Symmetric in size. No masses, skin changes, nipple drainage, or lymphadenopathy. ABDOMEN: Soft, normal bowel sounds, no distention noted.  No tenderness, rebound or guarding.  Well-healed vertical surgical scar. PELVIC: Normal appearing external genitalia; normal appearing vaginal mucosa and cervix.  No abnormal discharge noted.  Pap smear obtained.  Enlarged uterine size (12-14 size), no other palpable masses. Significant uterine tenderness on palpation, no  adnexal tenderness. MUSCULOSKELETAL: Normal range of motion. No tenderness.  No cyanosis, clubbing, or edema.  2+ distal pulses.   Assessment and Plan:  1. Galactorrhea 2. Pituitary microadenoma with hyperprolactinemia ([place]) Galactorrhea is likely due to elevated prolactin. Will recheck levels, also mammogram ordered.  If prolactin levels are still concerning, will need to see Dr. Dwyane Dee (Endocrinology) for further medical therapy and possibly need a repeat MRI to evaluate size of prolactinoma. - MM DIAG BREAST TOMO BILATERAL; Future - Prolactin - TSH Mammogram scheduled  3. Dyspareunia in female Enlarged  uterus on exam, will evaluate infection screen and uterus for any pathology. - Cytology - PAP - US PELVIC COMPLETE WITH TRANSVAGINAL; Future  4. Well woman exam with routine gynecological exam - Cytology - PAP Will follow up results of pap smear and manage accordingly. Routine preventative health maintenance measures emphasized. Please refer to After Visit Summary for other counseling recommendations.    Verita Schneiders, MD, [place] for [employer], [place]

## 2017-07-20 NOTE — Progress Notes (Signed)
Discuss mammogram Prolactin levels has been high

## 2017-07-20 NOTE — Patient Instructions (Signed)
Preventive Care 18-39 Years, Female Preventive care refers to lifestyle choices and visits with your health care provider that can promote health and wellness. What does preventive care include?  A yearly physical exam. This is also called an annual well check.  Dental exams once or twice a year.  Routine eye exams. Ask your health care provider how often you should have your eyes checked.  Personal lifestyle choices, including: ? Daily care of your teeth and gums. ? Regular physical activity. ? Eating a healthy diet. ? Avoiding tobacco and drug use. ? Limiting alcohol use. ? Practicing safe sex. ? Taking vitamin and mineral supplements as recommended by your health care provider. What happens during an annual well check? The services and screenings done by your health care provider during your annual well check will depend on your age, overall health, lifestyle risk factors, and family history of disease. Counseling Your health care provider may ask you questions about your:  Alcohol use.  Tobacco use.  Drug use.  Emotional well-being.  Home and relationship well-being.  Sexual activity.  Eating habits.  Work and work Statistician.  Method of birth control.  Menstrual cycle.  Pregnancy history.  Screening You may have the following tests or measurements:  Height, weight, and BMI.  Diabetes screening. This is done by checking your blood sugar (glucose) after you have not eaten for a while (fasting).  Blood pressure.  Lipid and cholesterol levels. These may be checked every 5 years starting at age 66.  Skin check.  Hepatitis C blood test.  Hepatitis B blood test.  Sexually transmitted disease (STD) testing.  BRCA-related cancer screening. This may be done if you have a family history of breast, ovarian, tubal, or peritoneal cancers.  Pelvic exam and Pap test. This may be done every 3 years starting at age 40. Starting at age 59, this may be done every 5  years if you have a Pap test in combination with an HPV test.  Discuss your test results, treatment options, and if necessary, the need for more tests with your health care provider. Vaccines Your health care provider may recommend certain vaccines, such as:  Influenza vaccine. This is recommended every year.  Tetanus, diphtheria, and acellular pertussis (Tdap, Td) vaccine. You may need a Td booster every 10 years.  Varicella vaccine. You may need this if you have not been vaccinated.  HPV vaccine. If you are 69 or younger, you may need three doses over 6 months.  Measles, mumps, and rubella (MMR) vaccine. You may need at least one dose of MMR. You may also need a second dose.  Pneumococcal 13-valent conjugate (PCV13) vaccine. You may need this if you have certain conditions and were not previously vaccinated.  Pneumococcal polysaccharide (PPSV23) vaccine. You may need one or two doses if you smoke cigarettes or if you have certain conditions.  Meningococcal vaccine. One dose is recommended if you are age 27-21 years and a first-year college student living in a residence hall, or if you have one of several medical conditions. You may also need additional booster doses.  Hepatitis A vaccine. You may need this if you have certain conditions or if you travel or work in places where you may be exposed to hepatitis A.  Hepatitis B vaccine. You may need this if you have certain conditions or if you travel or work in places where you may be exposed to hepatitis B.  Haemophilus influenzae type b (Hib) vaccine. You may need this if  you have certain risk factors.  Talk to your health care provider about which screenings and vaccines you need and how often you need them. This information is not intended to replace advice given to you by your health care provider. Make sure you discuss any questions you have with your health care provider. Document Released: 08/04/2001 Document Revised: 02/26/2016  Document Reviewed: 04/09/2015 Elsevier Interactive Patient Education  [employer].

## 2017-07-20 NOTE — Progress Notes (Signed)
Needs pap

## 2017-07-21 ENCOUNTER — Other Ambulatory Visit: Payer: Self-pay | Admitting: Obstetrics & Gynecology

## 2017-07-21 ENCOUNTER — Telehealth: Payer: Self-pay | Admitting: Endocrinology

## 2017-07-21 DIAGNOSIS — D352 Benign neoplasm of pituitary gland: Secondary | ICD-10-CM

## 2017-07-21 DIAGNOSIS — E229 Hyperfunction of pituitary gland, unspecified: Principal | ICD-10-CM

## 2017-07-21 DIAGNOSIS — R7989 Other specified abnormal findings of blood chemistry: Secondary | ICD-10-CM

## 2017-07-21 LAB — PROLACTIN: Prolactin: 181.4 ng/mL — ABNORMAL HIGH (ref 4.8–23.3)

## 2017-07-21 LAB — TSH: TSH: 1.17 u[IU]/mL (ref 0.450–4.500)

## 2017-07-21 NOTE — Telephone Encounter (Signed)
error 

## 2017-07-22 LAB — CYTOLOGY - PAP
Bacterial vaginitis: NEGATIVE
CANDIDA VAGINITIS: NEGATIVE
Chlamydia: NEGATIVE
DIAGNOSIS: NEGATIVE
HPV: NOT DETECTED
Neisseria Gonorrhea: NEGATIVE
TRICH (WINDOWPATH): NEGATIVE

## 2017-07-26 ENCOUNTER — Ambulatory Visit (HOSPITAL_COMMUNITY): Payer: Medicaid Other

## 2017-07-29 ENCOUNTER — Encounter: Payer: Self-pay | Admitting: Obstetrics & Gynecology

## 2017-07-29 ENCOUNTER — Ambulatory Visit (HOSPITAL_COMMUNITY)
Admission: RE | Admit: 2017-07-29 | Discharge: 2017-07-29 | Disposition: A | Discharge status: Home / Self Care | Payer: Medicaid Other | Source: Ambulatory Visit | Attending: Obstetrics & Gynecology | Admitting: Obstetrics & Gynecology

## 2017-07-29 DIAGNOSIS — N941 Unspecified dyspareunia: Secondary | ICD-10-CM | POA: Insufficient documentation

## 2017-07-29 DIAGNOSIS — N8003 Adenomyosis of the uterus: Secondary | ICD-10-CM | POA: Insufficient documentation

## 2017-07-29 DIAGNOSIS — N809 Endometriosis, unspecified: Secondary | ICD-10-CM

## 2017-07-30 ENCOUNTER — Other Ambulatory Visit: Payer: Self-pay | Admitting: Obstetrics & Gynecology

## 2017-08-02 ENCOUNTER — Ambulatory Visit: Payer: Medicaid Other

## 2017-08-02 ENCOUNTER — Ambulatory Visit
Admission: RE | Admit: 2017-08-02 | Discharge: 2017-08-02 | Disposition: A | Discharge status: Home / Self Care | Payer: Medicaid Other | Source: Ambulatory Visit | Attending: Obstetrics & Gynecology | Admitting: Obstetrics & Gynecology

## 2017-08-02 DIAGNOSIS — N643 Galactorrhea not associated with childbirth: Secondary | ICD-10-CM

## 2017-08-10 ENCOUNTER — Telehealth: Payer: Self-pay | Admitting: General Practice

## 2017-08-10 NOTE — Telephone Encounter (Signed)
-----   Message from Osborne Oman, MD sent at 07/29/2017  2:42 PM EST ----- Needs appointment to discuss management of adenomyosis which could be causing her dyspareunia.  Please call to inform patient of results and recommendations.

## 2017-08-10 NOTE — Telephone Encounter (Signed)
Called patient, no answer- left message stating we are trying to reach you with results, please check your mychart account for more information.

## 2017-08-16 ENCOUNTER — Ambulatory Visit (HOSPITAL_COMMUNITY): Admission: RE | Admit: 2017-08-16 | Payer: Medicaid Other | Source: Ambulatory Visit

## 2017-08-17 ENCOUNTER — Ambulatory Visit: Payer: Medicaid Other | Admitting: Obstetrics & Gynecology

## 2017-08-23 ENCOUNTER — Ambulatory Visit (HOSPITAL_COMMUNITY)
Admission: RE | Admit: 2017-08-23 | Discharge: 2017-08-23 | Disposition: A | Discharge status: Home / Self Care | Payer: Medicaid Other | Source: Ambulatory Visit | Attending: Obstetrics & Gynecology | Admitting: Obstetrics & Gynecology

## 2017-08-23 DIAGNOSIS — D352 Benign neoplasm of pituitary gland: Secondary | ICD-10-CM

## 2017-08-23 DIAGNOSIS — R7989 Other specified abnormal findings of blood chemistry: Secondary | ICD-10-CM

## 2017-08-23 DIAGNOSIS — E229 Hyperfunction of pituitary gland, unspecified: Secondary | ICD-10-CM | POA: Diagnosis present

## 2017-08-23 MED ORDER — GADOBENATE DIMEGLUMINE 529 MG/ML IV SOLN
20.0000 mL | Freq: Once | INTRAVENOUS | Status: AC | PRN
  Administered 2017-08-23: 13 mL via INTRAVENOUS

## 2017-09-13 ENCOUNTER — Encounter: Payer: Self-pay | Admitting: Obstetrics & Gynecology

## 2017-09-13 ENCOUNTER — Ambulatory Visit (INDEPENDENT_AMBULATORY_CARE_PROVIDER_SITE_OTHER): Payer: Medicaid Other | Admitting: Obstetrics & Gynecology

## 2017-09-13 VITALS — BP 103/67 | HR 72 | Wt 164.8 lb

## 2017-09-13 DIAGNOSIS — R7989 Other specified abnormal findings of blood chemistry: Secondary | ICD-10-CM

## 2017-09-13 DIAGNOSIS — N800 Endometriosis of uterus: Secondary | ICD-10-CM | POA: Diagnosis not present

## 2017-09-13 DIAGNOSIS — D352 Benign neoplasm of pituitary gland: Secondary | ICD-10-CM | POA: Diagnosis not present

## 2017-09-13 DIAGNOSIS — E229 Hyperfunction of pituitary gland, unspecified: Secondary | ICD-10-CM

## 2017-09-13 DIAGNOSIS — N8003 Adenomyosis of the uterus: Secondary | ICD-10-CM

## 2017-09-13 DIAGNOSIS — N809 Endometriosis, unspecified: Secondary | ICD-10-CM

## 2017-09-13 DIAGNOSIS — N979 Female infertility, unspecified: Secondary | ICD-10-CM | POA: Diagnosis not present

## 2017-09-13 NOTE — Progress Notes (Signed)
GYNECOLOGY OFFICE VISIT NOTE  History:  37 y.o. [id number] here today to follow up results from MRI done to follow up pituitary adenoma and elevated prolactin; and also follow up recent diagnosis of adenomyosis on pelvic ultrasound.  Ultrasound was done for report of dyspareunia.  Please refer to imaging reports below. 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 Dwyane Dee.  She did follow up but she reported that the medications she will be contraindicated in pregnancy. She desperately wants to conceive and wants to see if there are other options, wants to talk to another specialist. Of note, patient had a HSG in 2005 that showed left fallopian tube occlusion and partial occlusion of the right fallopian tube but had a successful pregnancy in 2011.  She denies any abnormal vaginal discharge, bleeding, pelvic pain or other concerns.   Past Medical History:  Diagnosis Date  . Axillary mass    treated with doxy 05/2006  . DEPRESSION 12/28/2007   Qualifier: Diagnosis of  By: Ditzler RN, Debra    . Female infertility    histosalpingogram showed left fallopian tube occlusion, right partially open.  . Irregular menses    followed by Dr Kalman Shan and [facility] infertility clinic  . Pituitary microadenoma with hyperprolactinemia ([place])   . Sepsis secondary to UTI ([place]) 02/24/2017  . Typhoid fever    hx of s/pabdominal  surgery for complication.  Marland Kitchen UTI (lower urinary tract infection)    Past Surgical History:  Procedure Laterality Date  . ABDOMINAL SURGERY     R/T typhoid fever 2002 and revision in 2004   The following portions of the patient's history were reviewed and updated as appropriate: allergies, current medications, past family history, past medical history, past social history, past surgical history and problem list.   Health Maintenance:  Normal pap and negative HRHPV on 07/20/2017.    Review of  Systems:  Pertinent items noted in HPI and remainder of comprehensive ROS otherwise negative.  Objective:  Physical Exam BP 103/67   Pulse 72   Wt 164 lb 12.8 oz (74.8 kg)   LMP 08/19/2017   BMI 26.60 kg/m  CONSTITUTIONAL: Well-developed, well-nourished female in no acute distress.  HENT:  Normocephalic, atraumatic. External right and left ear normal. Oropharynx is clear and moist EYES: Conjunctivae and EOM are normal. Pupils are equal, round, and reactive to light. No scleral icterus.  NECK: Normal range of motion, supple, no masses SKIN: Skin is warm and dry. No rash noted. Not diaphoretic. No erythema. No pallor. NEUROLOGIC: Alert and oriented to person, place, and time. Normal reflexes, muscle tone coordination. No cranial nerve deficit noted. PSYCHIATRIC: Normal mood and affect. Normal behavior. Normal judgment and thought content. CARDIOVASCULAR: Normal heart rate noted RESPIRATORY: Effort and breath sounds normal, no problems with respiration noted ABDOMEN: Soft, no distention noted.   PELVIC: Deferred MUSCULOSKELETAL: Normal range of motion. No edema noted.  Labs and Imaging 07/29/2017 TRANSABDOMINAL AND TRANSVAGINAL ULTRASOUND OF PELVIS CLINICAL DATA: Dyspareunia COMPARISON:  None FINDINGS: Uterus Measurements: 9.7 x 4.9 x 6.0 cm. Diffusely heterogeneous echotexture without focal fibroid. Endometrium Thickness: 6 mm in thickness.  No focal abnormality visualized. Right ovary Measurements: 2.4 x 2.1 x 2.1 cm. Normal appearance/no adnexal mass. Left ovary Measurements: 3.1 x 1.6 x 1.6 cm. Normal appearance/no adnexal mass. Other findings  No abnormal free fluid. IMPRESSION: Heterogeneous echotexture throughout the uterus. This can be seen with adenomyosis.No acute findings. Electronically Signed  By: Rolm Baptise M.D. On: 07/29/2017 09:45  Mr Jeri Cos LT Contrast  Result Date: 08/23/2017 CLINICAL DATA:  Pituitary microadenoma with hyperprolactinemia.  Infertility, galactorrhea EXAM: MRI HEAD WITHOUT AND WITH CONTRAST TECHNIQUE: Multiplanar, multiecho pulse sequences of the brain and surrounding structures were obtained without and with intravenous contrast. CONTRAST:  45mL MULTIHANCE GADOBENATE DIMEGLUMINE 529 MG/ML IV SOLN COMPARISON:  MRI head 11/20/2014 FINDINGS: Brain: Dynamic pituitary protocol was performed with detailed imaging of the pituitary during contrast infusion. Pituitary overall is normal in size measuring approximately 5 mm in height. Relative hypoenhancement in the left side of the pituitary gland posteriorly compatible with microadenoma. On coronal images this measures approximately 3 x 9 mm and is unchanged from the prior study. There is depression of the floor of the sella on the left due to the microadenoma. Infundibulum remains normal. No invasion of the cavernous sinus. Optic chiasm normal. Ventricle size normal.  Negative for infarct or hemorrhage. Vascular: Normal arterial flow voids. Skull and upper cervical spine: Negative Sinuses/Orbits: Mild mucosal edema paranasal sinuses.  Normal orbit Other: None IMPRESSION: 3 x 9 mm pituitary microadenoma on the left is unchanged from the prior study. No invasion of the cavernous sinus. Otherwise negative MRI of the brain. Electronically Signed   By: Franchot Gallo M.D.   On: 08/23/2017 12:23   02/14/2004 Clinical Data:  Infertility. HYSTEROSALPINGOGRAM: Comparison:  None. After placement of a sterile speculum, the cervix was cleansed with Betadine solution.  Initial attempts at placement of a 5 [place] hysterosalpingography catheter were unsuccessful.  Cervical dilator demonstrated a firm irregular texture to the cervical canal, raising suspicion for multiple nabothian cysts.  After placement of tenaculum, the balloon of the hysterosalpingography catheter could be advanced far enough into the cervical canal to allow deployment.  Water soluble contrast was subsequently refluxed into the uterus  in a retrograde fashion with the catheter in this position.   There is prompt filling of both ovarian tubes.  There is spill of contrast from the end of the right tube, but the contrast remains focally concentrated around the fimbriated end of the tube, suggesting associated adhesions/scarring.  Although the left tube opacifies to the level of the ampulla, there is no spill of contrast from the end of the left tube. Uterine cavity has normal contour without evidence for synechiae or filling defects. The patient tolerated the procedure well. IMPRESSION Patent right ovarian tube although contrast does not flow away from the fimbriated end of the tube, suggesting there is adhesion/scarring associated with the end of the right ovarian tube.   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   Normal appearance of uterine cavity. Provider: Phebe Colla  Assessment & Plan:  1. Pituitary microadenoma with hyperprolactinemia (HCC) 2. Elevated prolactin level (HCC) 3. Female infertility, secondary Last prolactin level was 181.4 ng/mL on 07/20/17, up from 28 a year earlier.  Discussed need for management. Since she does not want medication, discussed possible surgery/transsphenoidal surgery. She does not want surgery either. Referred to [facility] at [facility] (she has been seen by them in past) to discuss possible other options for treatment and her infertility concerns. Will follow up recommendations and manage accordingly. - Ambulatory referral to Endocrinology  4. Adenomyosis of uterus Discussed ultrasound findings and that adenomyosis could cause dyspareunia. Change in positions emphasized, also taking analgesia as recommended.  Only definitive therapy is surgery/hysterectomy, she declines this for now. Will continue to monitor closely.     Routine  preventative health maintenance measures emphasized. Please refer to After Visit Summary for other counseling recommendations.   Return if  gynecologic symptoms worsen or fail to improve.   Total face-to-face time with patient: 25 minutes.  Over 50% of encounter was spent on counseling and coordination of care.   Jeanette Schneiders, MD, [place] for [employer], [place]

## 2017-09-13 NOTE — Patient Instructions (Signed)
Return to clinic for any scheduled appointments or for any gynecologic concerns as needed.    Pituitary Tumors Pituitary tumors are abnormal growths found in the pituitary gland. The pituitary gland is a small organ-about the size of a dime-located in the center of the brain. It makes hormones that affect growth and the functions of other glands in the body. Most pituitary tumors are benign. This means they are noncancerous. They grow slowly and do not spread to other parts of the body. A pituitary tumor may make the pituitary gland produce too many hormones. Tumors that make hormones are called functioning tumors (those that do not make hormones are called nonfunctioning tumors). Problems that can be caused by pituitary tumors include:  Breast milk production even though there is no pregnancy.  What are the causes? The cause of most pituitary tumors is not known. In some cases, these kinds of tumors run in a family. What increases the risk? Some cases of pituitary tumors are due to genetic factors that a person inherits that increase the likelihood of developing certain tumors, including pituitary tumors. What are the signs or symptoms?  Headaches.  Vision problems.  Weakness or low energy.  Clear fluid draining from the nose.  Changes in the sense of smell.  Feeling sick to your stomach (nauseous) and vomiting.  Problems caused by the production of too many hormones, such as: ? Infertility. ? Loss of menstrual periods in women. ? Abnormal growth. ? High blood pressure (hypertension). ? Heat or cold intolerance. ? Other skin and body changes. ? Nipple discharge. ? Decreased sexual function. How is this diagnosed? If you develop symptoms, you will be sent for a CT scan or MRI to look for pituitary tumors. If you know that these kinds of tumors run in your family, you may need to have your blood tested regularly to monitor pituitary hormone levels. How is this treated? These  tumors are best treated when they are found and diagnosed early. Treatments include:  Surgical removal of the tumor. This is the most common treatment. Transsphenoidal surgery. This is the most common way to remove pituitary tumors. Transsphenoidal means that the surgery is done through the sphenoid sinus, a hollow space in the skull behind the nasal passages and below the brain. The back wall of the sinus covers the pituitary gland.     Radiation therapy. During this treatment, high doses of X-rays are used to kill tumor cells.  Drug therapy. This involves using certain medicines to block the pituitary gland from producing too many hormones.  Follow these instructions at home:  Drink plenty of fluids.  Measure your urine output if directed to do so by your health care provider.  Do not pick your nose or remove any crusting.  Do not do any activities that require straining.  Take all medicines as directed by your health care provider.  Keep follow-up appointments as directed by your health care provider. Contact a health care provider if:  You have sudden, unusual thirst.  You are urinating frequently.  You have a headache that will not go away.  You have new vision changes.  You notice clear fluid leaking from your nose or ears, a sensation of fluid trickling down the back of your throat, or a salty taste in your mouth.  You are having trouble concentrating. Get help right away if:  Your symptoms suddenly become severe.  You have a nosebleed that does not stop after a few minutes.  You have  a fever over 101F (38.3C).  You have a severe headache or a stiff neck.  You are confused or not as alert as usual.  You have chest pain or shortness of breath. This information is not intended to replace advice given to you by your health care provider. Make sure you discuss any questions you have with your health care provider. Document Released: 05/29/2002 Document  Revised: 11/14/2015 Document Reviewed: 12/09/2012 Elsevier Interactive Patient Education  [employer].

## 2018-08-10 ENCOUNTER — Other Ambulatory Visit: Payer: Self-pay

## 2018-08-10 ENCOUNTER — Encounter (HOSPITAL_COMMUNITY): Payer: Self-pay | Admitting: Emergency Medicine

## 2018-08-10 ENCOUNTER — Emergency Department (HOSPITAL_COMMUNITY)
Admission: EM | Admit: 2018-08-10 | Discharge: 2018-08-10 | Disposition: A | Discharge status: Home / Self Care | Payer: Medicaid Other | Attending: Emergency Medicine | Admitting: Emergency Medicine

## 2018-08-10 DIAGNOSIS — M545 Low back pain, unspecified: Secondary | ICD-10-CM

## 2018-08-10 DIAGNOSIS — M546 Pain in thoracic spine: Secondary | ICD-10-CM | POA: Diagnosis not present

## 2018-08-10 DIAGNOSIS — F329 Major depressive disorder, single episode, unspecified: Secondary | ICD-10-CM | POA: Insufficient documentation

## 2018-08-10 DIAGNOSIS — M549 Dorsalgia, unspecified: Secondary | ICD-10-CM | POA: Diagnosis present

## 2018-08-10 NOTE — ED Notes (Signed)
Pt states having back pain, no signs of trauma present on assessment
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
## 2020-08-27 ENCOUNTER — Other Ambulatory Visit: Payer: Self-pay | Admitting: Obstetrics & Gynecology

## 2020-08-27 DIAGNOSIS — Z1231 Encounter for screening mammogram for malignant neoplasm of breast: Secondary | ICD-10-CM

## 2020-10-23 ENCOUNTER — Other Ambulatory Visit: Payer: Self-pay

## 2020-10-23 ENCOUNTER — Ambulatory Visit
Admission: RE | Admit: 2020-10-23 | Discharge: 2020-10-23 | Disposition: A | Discharge status: Home / Self Care | Payer: Medicaid Other | Source: Ambulatory Visit | Attending: Obstetrics & Gynecology | Admitting: Obstetrics & Gynecology

## 2020-10-23 DIAGNOSIS — Z1231 Encounter for screening mammogram for malignant neoplasm of breast: Secondary | ICD-10-CM

## 2022-05-28 ENCOUNTER — Other Ambulatory Visit: Payer: Self-pay | Admitting: Family Medicine

## 2022-05-28 DIAGNOSIS — Z1231 Encounter for screening mammogram for malignant neoplasm of breast: Secondary | ICD-10-CM

## 2022-07-27 ENCOUNTER — Ambulatory Visit
Admission: RE | Admit: 2022-07-27 | Discharge: 2022-07-27 | Disposition: A | Discharge status: Home / Self Care | Payer: Medicaid Other | Source: Ambulatory Visit | Attending: Family Medicine | Admitting: Family Medicine

## 2022-07-27 DIAGNOSIS — Z1231 Encounter for screening mammogram for malignant neoplasm of breast: Secondary | ICD-10-CM

## 2023-03-31 ENCOUNTER — Other Ambulatory Visit: Payer: Self-pay | Admitting: Family Medicine

## 2023-03-31 ENCOUNTER — Ambulatory Visit
Admission: RE | Admit: 2023-03-31 | Discharge: 2023-03-31 | Disposition: A | Discharge status: Home / Self Care | Payer: Medicaid Other | Source: Ambulatory Visit | Attending: Family Medicine | Admitting: Family Medicine

## 2023-03-31 DIAGNOSIS — R059 Cough, unspecified: Secondary | ICD-10-CM
